# Patient Record
Sex: Female | Born: 2000 | Race: White | Hispanic: No | Smoking: Never smoker
Health system: Southern US, Community
[De-identification: ages and names within clinical notes are randomized; demographics above are authoritative.]

## PROBLEM LIST (undated history)

## (undated) DIAGNOSIS — G35 Multiple sclerosis: Secondary | ICD-10-CM

## (undated) DIAGNOSIS — D649 Anemia, unspecified: Secondary | ICD-10-CM

## (undated) DIAGNOSIS — F99 Mental disorder, not otherwise specified: Secondary | ICD-10-CM

## (undated) DIAGNOSIS — R51 Headache: Secondary | ICD-10-CM

## (undated) HISTORY — DX: Mental disorder, not otherwise specified: F99

## (undated) HISTORY — DX: Anemia, unspecified: D64.9

## (undated) HISTORY — PX: APPENDECTOMY: SHX54

## (undated) HISTORY — PX: OTHER SURGICAL HISTORY: SHX169

---
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
## 2018-02-03 ENCOUNTER — Emergency Department
Admission: EM | Admit: 2018-02-03 | Discharge: 2018-02-03 | Disposition: A | Discharge status: Home / Self Care | Payer: Medicaid Other | Attending: Emergency Medicine | Admitting: Emergency Medicine

## 2018-02-03 ENCOUNTER — Other Ambulatory Visit: Payer: Self-pay

## 2018-02-03 DIAGNOSIS — L550 Sunburn of first degree: Secondary | ICD-10-CM | POA: Insufficient documentation

## 2018-02-03 DIAGNOSIS — F909 Attention-deficit hyperactivity disorder, unspecified type: Secondary | ICD-10-CM | POA: Insufficient documentation

## 2018-02-03 DIAGNOSIS — L559 Sunburn, unspecified: Secondary | ICD-10-CM | POA: Diagnosis present

## 2018-02-03 DIAGNOSIS — L551 Sunburn of second degree: Secondary | ICD-10-CM | POA: Diagnosis not present

## 2018-02-03 DIAGNOSIS — Z79899 Other long term (current) drug therapy: Secondary | ICD-10-CM | POA: Insufficient documentation

## 2018-02-03 MED ORDER — SILVER SULFADIAZINE 1 % EX CREA: TOPICAL_CREAM | CUTANEOUS | 1 refills | Status: DC
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
## 2018-02-03 NOTE — Discharge Instructions (Addendum)
You may also take Aspirin 325mg  as directed on the packaging.  Apply Vitamin E cream to the areas that are not blistered and to the face.
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
## 2018-10-10 ENCOUNTER — Observation Stay
Admission: EM | Admit: 2018-10-10 | Discharge: 2018-10-12 | Disposition: A | Discharge status: Home / Self Care | Payer: 59 | Attending: Internal Medicine | Admitting: Internal Medicine

## 2018-10-10 ENCOUNTER — Emergency Department: Payer: 59

## 2018-10-10 ENCOUNTER — Other Ambulatory Visit: Payer: Self-pay

## 2018-10-10 ENCOUNTER — Encounter: Payer: Self-pay | Admitting: *Deleted

## 2018-10-10 DIAGNOSIS — H579 Unspecified disorder of eye and adnexa: Secondary | ICD-10-CM | POA: Diagnosis present

## 2018-10-10 DIAGNOSIS — H519 Unspecified disorder of binocular movement: Principal | ICD-10-CM | POA: Insufficient documentation

## 2018-10-10 DIAGNOSIS — R112 Nausea with vomiting, unspecified: Secondary | ICD-10-CM | POA: Insufficient documentation

## 2018-10-10 DIAGNOSIS — E876 Hypokalemia: Secondary | ICD-10-CM | POA: Diagnosis not present

## 2018-10-10 DIAGNOSIS — R42 Dizziness and giddiness: Secondary | ICD-10-CM | POA: Insufficient documentation

## 2018-10-10 DIAGNOSIS — H532 Diplopia: Secondary | ICD-10-CM | POA: Diagnosis not present

## 2018-10-10 DIAGNOSIS — H538 Other visual disturbances: Secondary | ICD-10-CM

## 2018-10-10 LAB — CBC
HCT: 40.5 % (ref 36.0–46.0)
HEMOGLOBIN: 13.3 g/dL (ref 12.0–15.0)
MCH: 28.4 pg (ref 26.0–34.0)
MCHC: 32.8 g/dL (ref 30.0–36.0)
MCV: 86.5 fL (ref 80.0–100.0)
Platelets: 330 10*3/uL (ref 150–400)
RBC: 4.68 MIL/uL (ref 3.87–5.11)
RDW: 12.9 % (ref 11.5–15.5)
WBC: 8.1 10*3/uL (ref 4.0–10.5)
nRBC: 0 % (ref 0.0–0.2)

## 2018-10-10 LAB — URINALYSIS, COMPLETE (UACMP) WITH MICROSCOPIC
Bacteria, UA: NONE SEEN
Bilirubin Urine: NEGATIVE
Glucose, UA: NEGATIVE mg/dL
Ketones, ur: NEGATIVE mg/dL
Nitrite: NEGATIVE
Protein, ur: NEGATIVE mg/dL
Specific Gravity, Urine: 1.019 (ref 1.005–1.030)
pH: 5 (ref 5.0–8.0)

## 2018-10-10 LAB — BASIC METABOLIC PANEL
Anion gap: 10 (ref 5–15)
BUN: 8 mg/dL (ref 6–20)
CO2: 23 mmol/L (ref 22–32)
Calcium: 8.7 mg/dL — ABNORMAL LOW (ref 8.9–10.3)
Chloride: 104 mmol/L (ref 98–111)
Creatinine, Ser: 0.63 mg/dL (ref 0.44–1.00)
GFR calc non Af Amer: >60 mL/min (ref >60)
Glucose, Bld: 130 mg/dL — ABNORMAL HIGH (ref 70–99)
Potassium: 3.3 mmol/L — ABNORMAL LOW (ref 3.5–5.1)
Sodium: 137 mmol/L (ref 135–145)

## 2018-10-10 LAB — POCT PREGNANCY, URINE
PREG TEST UR: NEGATIVE
Preg Test, Ur: NEGATIVE

## 2018-10-10 MED ORDER — POTASSIUM CHLORIDE CRYS ER 20 MEQ PO TBCR
40.0000 meq | EXTENDED_RELEASE_TABLET | Freq: Once | ORAL | Status: AC
  Administered 2018-10-10: 40 meq via ORAL
  Filled 2018-10-10: qty 2

## 2018-10-10 MED ORDER — SODIUM CHLORIDE 0.9 % IV BOLUS
1000.0000 mL | Freq: Once | INTRAVENOUS | Status: AC
  Administered 2018-10-10: 1000 mL via INTRAVENOUS

## 2018-10-10 MED ORDER — POLYETHYLENE GLYCOL 3350 17 G PO PACK: 17.0000 g | PACK | Freq: Every day | ORAL | Status: DC | PRN

## 2018-10-10 MED ORDER — ACETAMINOPHEN 650 MG RE SUPP: 650.0000 mg | Freq: Four times a day (QID) | RECTAL | Status: DC | PRN

## 2018-10-10 MED ORDER — ENOXAPARIN SODIUM 40 MG/0.4ML ~~LOC~~ SOLN
40.0000 mg | Freq: Two times a day (BID) | SUBCUTANEOUS | Status: DC
  Administered 2018-10-11: 40 mg via SUBCUTANEOUS
  Filled 2018-10-10: qty 0.4

## 2018-10-10 MED ORDER — ONDANSETRON HCL 4 MG/2ML IJ SOLN: 4.0000 mg | Freq: Four times a day (QID) | INTRAMUSCULAR | Status: DC | PRN

## 2018-10-10 MED ORDER — ONDANSETRON HCL 4 MG PO TABS: 4.0000 mg | ORAL_TABLET | Freq: Four times a day (QID) | ORAL | Status: DC | PRN

## 2018-10-10 MED ORDER — ACETAMINOPHEN 325 MG PO TABS: 650.0000 mg | ORAL_TABLET | Freq: Four times a day (QID) | ORAL | Status: DC | PRN

## 2018-10-10 NOTE — ED Notes (Signed)
Visual acuity L 20/50 and R 20/50

## 2018-10-10 NOTE — ED Notes (Signed)
Family at bedside. 
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
## 2018-10-10 NOTE — Progress Notes (Signed)
Lovenox changed to 40 mg BID for BMI >40 and CrCl >30. 

## 2018-10-10 NOTE — ED Notes (Addendum)
Pt states that she feels dizzy constantly and when she tries to look left or right "feels like two magnets rejecting." Pt was diagnosed with vertigo last week, and states she does not like taking her vertigo medication because it makes her drowsy. Pt went from lying to sitting quickly with no distress.
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
## 2018-10-10 NOTE — ED Notes (Signed)
Sherrie RN, aware of bed assigned 

## 2018-10-10 NOTE — ED Notes (Signed)
Pt back from MRI 

## 2018-10-10 NOTE — ED Triage Notes (Signed)
PT to ED reporting dizziness x 11 days. pT was seen at PCP and given Ativert for vertigo. Pt stopped taking medication because it was not helping and made her drowsy. Pt is now unable to move her eyes to the far left or the far right x 3 days and is unable to focus. PT does not wear glasses's normally but has 20/100 in triage. No new head trauma and no other neuro deficits noted.

## 2018-10-10 NOTE — ED Notes (Signed)
Pt eating dinner.  Family at bedside.

## 2018-10-10 NOTE — Plan of Care (Signed)
  Problem: Education: Goal: Knowledge of General Education information will improve Description: Including pain rating scale, medication(s)/side effects and non-pharmacologic comfort measures Outcome: Progressing   Problem: Clinical Measurements: Goal: Ability to maintain clinical measurements within normal limits will improve Outcome: Progressing Goal: Will remain free from infection Outcome: Progressing   Problem: Activity: Goal: Risk for activity intolerance will decrease Outcome: Progressing   Problem: Safety: Goal: Ability to remain free from injury will improve Outcome: Progressing   Problem: Skin Integrity: Goal: Risk for impaired skin integrity will decrease Outcome: Progressing   

## 2018-10-11 DIAGNOSIS — H519 Unspecified disorder of binocular movement: Secondary | ICD-10-CM | POA: Diagnosis not present

## 2018-10-11 LAB — BASIC METABOLIC PANEL
Anion gap: 4 — ABNORMAL LOW (ref 5–15)
BUN: 8 mg/dL (ref 6–20)
CHLORIDE: 110 mmol/L (ref 98–111)
CO2: 25 mmol/L (ref 22–32)
Calcium: 8.3 mg/dL — ABNORMAL LOW (ref 8.9–10.3)
Creatinine, Ser: 0.54 mg/dL (ref 0.44–1.00)
GFR calc non Af Amer: >60 mL/min (ref >60)
Glucose, Bld: 98 mg/dL (ref 70–99)
Potassium: 4.2 mmol/L (ref 3.5–5.1)
Sodium: 139 mmol/L (ref 135–145)

## 2018-10-11 LAB — PROTIME-INR
INR: 0.99
Prothrombin Time: 13 seconds (ref 11.4–15.2)

## 2018-10-11 LAB — TSH: TSH: 1.507 u[IU]/mL (ref 0.350–4.500)

## 2018-10-11 LAB — VITAMIN B12: Vitamin B-12: 127 pg/mL — ABNORMAL LOW (ref 180–914)

## 2018-10-11 LAB — APTT: APTT: 33 s (ref 24–36)
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
## 2018-10-12 ENCOUNTER — Observation Stay: Payer: 59

## 2018-10-12 DIAGNOSIS — H519 Unspecified disorder of binocular movement: Secondary | ICD-10-CM | POA: Diagnosis not present

## 2018-10-12 LAB — PROTEIN AND GLUCOSE, CSF
Glucose, CSF: 63 mg/dL (ref 40–70)
Total  Protein, CSF: 23 mg/dL (ref 15–45)

## 2018-10-12 LAB — SEDIMENTATION RATE: Sed Rate: 31 mm/hr — ABNORMAL HIGH (ref 0–20)

## 2018-10-12 LAB — CSF CELL COUNT WITH DIFFERENTIAL
Eosinophils, CSF: 0 %
Lymphs, CSF: 94 %
Monocyte-Macrophage-Spinal Fluid: 6 %
OTHER CELLS CSF: 0
RBC Count, CSF: 2 /mm3 (ref 0–3)
Segmented Neutrophils-CSF: 0 %
Tube #: 3
WBC, CSF: 42 /mm3 (ref 0–5)

## 2018-10-12 LAB — HIV ANTIBODY (ROUTINE TESTING W REFLEX): HIV Screen 4th Generation wRfx: NONREACTIVE

## 2018-10-12 MED ORDER — CYANOCOBALAMIN 1000 MCG PO TABS: 1000.0000 ug | ORAL_TABLET | Freq: Every day | ORAL | 1 refills | Status: DC

## 2018-10-12 MED ORDER — GADOBUTROL 1 MMOL/ML IV SOLN
10.0000 mL | Freq: Once | INTRAVENOUS | Status: AC | PRN
  Administered 2018-10-12: 15:00:00 10 mL via INTRAVENOUS

## 2018-10-12 MED ORDER — VITAMIN B-12 1000 MCG PO TABS
1000.0000 ug | ORAL_TABLET | Freq: Every day | ORAL | Status: DC
  Administered 2018-10-12: 1000 ug via ORAL

## 2018-10-12 NOTE — Discharge Summary (Signed)
[facility] [place] NAME: Jasmine Hopkins    MR#:  [id number]  DATE OF BIRTH:  10-Sep-2001  DATE OF ADMISSION:  10/10/2018 ADMITTING PHYSICIAN: Sela Hua, MD  DATE OF DISCHARGE: 10/12/2018  PRIMARY CARE PHYSICIAN: Lorelee Market, MD    ADMISSION DIAGNOSIS:  Diplopia [H53.2] Dizziness [R42] Blurred vision, bilateral [H53.8] Abnormal eye movements [H51.9] Nausea and vomiting, intractability of vomiting not specified, unspecified vomiting type [R11.2]  DISCHARGE DIAGNOSIS:  Active Problems:   Eye problem   SECONDARY DIAGNOSIS:   Past Medical History:  Diagnosis Date  . Headache(784.0)     HOSPITAL COURSE:   * Eye movement disorder   Have no horizontal movements in both eyes.    Seen by ophthalmology, no clear reasons from his side- suggest to follow with Neurology and if not than Conversion disorder.  MRI and CT head negative.   Neuro has ordered Vit B12, Thiamin, TSH .   VIt b12 low- replace   TSH- normal   Thiamin - still in process   ESR high.   LP done- 42 WBCs, mostly Lymphocytes, Normal Glucose.   Discussed with Neurologist- No clear explaination for this WBCs.   Pt is asymptomatic other than eye movements.   Sent multiple other test also.   Neuro suggest to follow in clinic in 1 week to check the results.   Explained pt, her aunt ( pt lives with her) and cousin, they agree with plan.  * Hypokalemia  Improved.  DISCHARGE CONDITIONS:   Stable.  CONSULTS OBTAINED:  Treatment Team:  Alexis Goodell, MD  DRUG ALLERGIES:  No Known Allergies  DISCHARGE MEDICATIONS:   Allergies as of 10/12/2018   No Known Allergies     Medication List    STOP taking these medications   Cetirizine HCl 10 MG Caps   FLUoxetine 20 MG capsule Commonly known as:  PROZAC   methylphenidate 36 MG CR tablet Commonly known as:  CONCERTA   silver sulfADIAZINE 1 % cream Commonly known as:  SILVADENE     TAKE  these medications   cyanocobalamin 1000 MCG tablet Take 1 tablet (1,000 mcg total) by mouth daily.   ibuprofen 400 MG tablet Commonly known as:  ADVIL,MOTRIN Take 1 tablet (400 mg total) by mouth every 6 (six) hours as needed (HA pain). Patient may resume home supply.        DISCHARGE INSTRUCTIONS:    Follow with neuro clinic in 1 week.  If you experience worsening of your admission symptoms, develop shortness of breath, life threatening emergency, suicidal or homicidal thoughts you must seek medical attention immediately by calling 911 or calling your MD immediately  if symptoms less severe.  You Must read complete instructions/literature along with all the possible adverse reactions/side effects for all the Medicines you take and that have been prescribed to you. Take any new Medicines after you have completely understood and accept all the possible adverse reactions/side effects.   Please note  You were cared for by a hospitalist during your hospital stay. If you have any questions about your discharge medications or the care you received while you were in the hospital after you are discharged, you can call the unit and asked to speak with the hospitalist on call if the hospitalist that took care of you is not available. Once you are discharged, your primary care physician will handle any further medical issues. Please note that NO REFILLS for any discharge medications will be  authorized once you are discharged, as it is imperative that you return to your primary care physician (or establish a relationship with a primary care physician if you do not have one) for your aftercare needs so that they can reassess your need for medications and monitor your lab values.    Today   CHIEF COMPLAINT:   Chief Complaint  Patient presents with  . Dizziness    HISTORY OF PRESENT ILLNESS:  Jasmine Hopkins  is a 18 y.o. female with no past medical history who presented to the ED with inability  to move her eyes side to side for the last 3 days.  This occurred out of nowhere.  It has been going on constantly.  When she tries to move her eyes side to side, it feels like "when you trying to push two opposing sides of the magnet together".  She endorses some blurred vision.  She states she had one episode of photophobia yesterday, but it resolved on its own.  She denies any eye pain or trauma to the eyes or head.  She also endorses 1 headache over the past week, but no current headaches. She denies fevers or chills.  No numbness or tingling of her extremities.  No weakness.  No difficulties with balance or walking.  This has never happened to her before.  In the ED, vitals and labs were unremarkable.  CT head and MRI brain were negative.  Hospitalists were called for admission.    VITAL SIGNS:  Blood pressure 120/78, pulse 80, temperature 98.7 F (37.1 C), temperature source Oral, resp. rate 20, height '5\' 4"'  (1.626 m), weight 108.9 kg, last menstrual period 09/24/2018, SpO2 96 %.  I/O:  No intake or output data in the 24 hours ending 10/12/18 1311  PHYSICAL EXAMINATION:   GENERAL:  18 y.o.-year-old obese patient lying in the bed with no acute distress.  EYES: Pupils equal, round, reactive to light and accommodation. No scleral icterus. Extraocular muscles altered, no horizontal movements.  HEENT: Head atraumatic, normocephalic. Oropharynx and nasopharynx clear.  NECK:  Supple, no jugular venous distention. No thyroid enlargement, no tenderness.  LUNGS: Normal breath sounds bilaterally, no wheezing, rales,rhonchi or crepitation. No use of accessory muscles of respiration.  CARDIOVASCULAR: S1, S2 normal. No murmurs, rubs, or gallops.  ABDOMEN: Soft, nontender, nondistended. Bowel sounds present. No organomegaly or mass.  EXTREMITIES: No pedal edema, cyanosis, or clubbing.  NEUROLOGIC: Cranial nerves II through XII are intact- except for having no horizontal movements on her both eyes.  Muscle strength 5/5 in all extremities. Sensation intact. Gait not checked.  PSYCHIATRIC: The patient is alert and oriented x 3.  SKIN: No obvious rash, lesion, or ulcer.   DATA REVIEW:   CBC Recent Labs  Lab 10/10/18 1534  WBC 8.1  HGB 13.3  HCT 40.5  PLT 330    Chemistries  Recent Labs  Lab 10/11/18 0410  NA 139  K 4.2  CL 110  CO2 25  GLUCOSE 98  BUN 8  CREATININE 0.54  CALCIUM 8.3*    Cardiac Enzymes No results for input(s): TROPONINI in the last 168 hours.  Microbiology Results  Results for orders placed or performed during the hospital encounter of 10/10/18  CSF culture     Status: None (Preliminary result)   Collection Time: 10/12/18 11:23 AM  Result Value Ref Range Status   Specimen Description CSF  Final   Special Requests Normal  Final   Gram Stain   Final  NO ORGANISMS SEEN WBC SEEN RBCS SEEN Performed at [facility], [address] [place], [place] [address]    Culture PENDING  Incomplete   Report Status PENDING  Incomplete    RADIOLOGY:  Ct Head Wo Contrast  Result Date: 10/10/2018 CLINICAL DATA:  Dizziness for 11 days.  Decreased visual acuity. EXAM: CT HEAD WITHOUT CONTRAST TECHNIQUE: Contiguous axial images were obtained from the base of the skull through the vertex without intravenous contrast. COMPARISON:  01/07/2014 FINDINGS: Brain: No evidence of acute infarction, hemorrhage, hydrocephalus, extra-axial collection or mass lesion/mass effect. Vascular: No hyperdense vessel or unexpected calcification. Skull: Normal. Negative for fracture or focal lesion. Sinuses/Orbits: No acute finding. Other: None. IMPRESSION: No acute intracranial abnormality. Electronically Signed   By: Fidela Salisbury M.D.   On: 10/10/2018 16:23   Mr Brain Wo Contrast  Result Date: 10/10/2018 CLINICAL DATA:  Dizziness. EXAM: MRI HEAD WITHOUT CONTRAST TECHNIQUE: Multiplanar, multiecho pulse sequences of the brain and surrounding structures were obtained  without intravenous contrast. COMPARISON:  Head CT 10/10/2018 FINDINGS: Brain: There is no evidence of acute infarct, intracranial hemorrhage, mass, midline shift, or extra-axial fluid collection. The ventricles and sulci are normal. Small cystic changes are present in the pineal gland, not felt to be of any clinical significance and without a dominant lesion. The brain is otherwise unremarkable in signal. Vascular: Major intracranial vascular flow voids are preserved. Skull and upper cervical spine: Unremarkable bone marrow signal. Sinuses/Orbits: Unremarkable orbits. Paranasal sinuses and mastoid air cells are clear. Other: None. IMPRESSION: No acute or significant intracranial findings. Electronically Signed   By: Logan Bores M.D.   On: 10/10/2018 19:44    EKG:   Orders placed or performed during the hospital encounter of 10/10/18  . ED EKG  . ED EKG  . EKG      Management plans discussed with the patient, family and they are in agreement.  CODE STATUS:     Code Status Orders  (From admission, onward)         Start     Ordered   10/10/18 2238  Full code  Continuous     10/10/18 2237        Code Status History    Date Active Date Inactive Code Status Order ID Comments User Context   01/01/2014 2144 01/08/2014 1727 Full Code [id number]  Lurena Nida, NP Inpatient      TOTAL TIME TAKING CARE OF THIS PATIENT: 35 minutes.    Vaughan Basta M.D on 10/12/2018 at 1:11 PM  Between 7am to 6pm - Pager - [phone]  After 6pm go to [email or web address] - password [email or web address] [facility]  Office  [phone]  CC: Primary care physician; Lorelee Market, MD   Note: This dictation was prepared with Dragon dictation along with smaller phrase technology. Any transcriptional errors that result from this process are unintentional.

## 2018-10-12 NOTE — Progress Notes (Signed)
MRI and results reviewed by Dr. Thad Ranger and approved discharge.

## 2018-10-12 NOTE — Progress Notes (Signed)
[facility] [place] [facility]   PATIENT NAME: Jasmine Hopkins    MR#:  [id number]  DATE OF BIRTH:  2001/07/25  SUBJECTIVE:  CHIEF COMPLAINT:   Chief Complaint  Patient presents with  . Dizziness   Came with problems with her eye movements, no improvements. No other complains.  REVIEW OF SYSTEMS:  CONSTITUTIONAL: No fever, fatigue or weakness.  EYES: No blurred vision.  EARS, NOSE, AND THROAT: No tinnitus or ear pain.  RESPIRATORY: No cough, shortness of breath, wheezing or hemoptysis.  CARDIOVASCULAR: No chest pain, orthopnea, edema.  GASTROINTESTINAL: No nausea, vomiting, diarrhea or abdominal pain.  GENITOURINARY: No dysuria, hematuria.  ENDOCRINE: No polyuria, nocturia,  HEMATOLOGY: No anemia, easy bruising or bleeding SKIN: No rash or lesion. MUSCULOSKELETAL: No joint pain or arthritis.   NEUROLOGIC: No tingling, numbness, weakness.  PSYCHIATRY: No anxiety or depression.   ROS  DRUG ALLERGIES:  No Known Allergies  VITALS:  Blood pressure 120/78, pulse 80, temperature 98.7 F (37.1 C), temperature source Oral, resp. rate 20, height 5\' 4"  (1.626 m), weight 108.9 kg, last menstrual period 09/24/2018, SpO2 96 %.  PHYSICAL EXAMINATION:  GENERAL:  18 y.o.-year-old patient lying in the bed with no acute distress.  EYES: Pupils equal, round, reactive to light and accommodation. No scleral icterus. Extraocular muscles altered, no horizontal movements.  HEENT: Head atraumatic, normocephalic. Oropharynx and nasopharynx clear.  NECK:  Supple, no jugular venous distention. No thyroid enlargement, no tenderness.  LUNGS: Normal breath sounds bilaterally, no wheezing, rales,rhonchi or crepitation. No use of accessory muscles of respiration.  CARDIOVASCULAR: S1, S2 normal. No murmurs, rubs, or gallops.  ABDOMEN: Soft, nontender, nondistended. Bowel sounds present. No organomegaly or mass.  EXTREMITIES: No pedal edema, cyanosis, or clubbing.  NEUROLOGIC: Cranial  nerves II through XII are intact- except for having no horizontal movements on her both eyes. Muscle strength 5/5 in all extremities. Sensation intact. Gait not checked.  PSYCHIATRIC: The patient is alert and oriented x 3.  SKIN: No obvious rash, lesion, or ulcer.   Physical Exam LABORATORY PANEL:   CBC Recent Labs  Lab 10/10/18 1534  WBC 8.1  HGB 13.3  HCT 40.5  PLT 330   ------------------------------------------------------------------------------------------------------------------  Chemistries  Recent Labs  Lab 10/11/18 0410  NA 139  K 4.2  CL 110  CO2 25  GLUCOSE 98  BUN 8  CREATININE 0.54  CALCIUM 8.3*   ------------------------------------------------------------------------------------------------------------------  Cardiac Enzymes No results for input(s): TROPONINI in the last 168 hours. ------------------------------------------------------------------------------------------------------------------  RADIOLOGY:  Ct Head Wo Contrast  Result Date: 10/10/2018 CLINICAL DATA:  Dizziness for 11 days.  Decreased visual acuity. EXAM: CT HEAD WITHOUT CONTRAST TECHNIQUE: Contiguous axial images were obtained from the base of the skull through the vertex without intravenous contrast. COMPARISON:  01/07/2014 FINDINGS: Brain: No evidence of acute infarction, hemorrhage, hydrocephalus, extra-axial collection or mass lesion/mass effect. Vascular: No hyperdense vessel or unexpected calcification. Skull: Normal. Negative for fracture or focal lesion. Sinuses/Orbits: No acute finding. Other: None. IMPRESSION: No acute intracranial abnormality. Electronically Signed   By: Ted Mcalpine M.D.   On: 10/10/2018 16:23   Mr Brain Wo Contrast  Result Date: 10/10/2018 CLINICAL DATA:  Dizziness. EXAM: MRI HEAD WITHOUT CONTRAST TECHNIQUE: Multiplanar, multiecho pulse sequences of the brain and surrounding structures were obtained without intravenous contrast. COMPARISON:  Head CT  10/10/2018 FINDINGS: Brain: There is no evidence of acute infarct, intracranial hemorrhage, mass, midline shift, or extra-axial fluid collection. The ventricles and sulci are normal. Small cystic changes  are present in the pineal gland, not felt to be of any clinical significance and without a dominant lesion. The brain is otherwise unremarkable in signal. Vascular: Major intracranial vascular flow voids are preserved. Skull and upper cervical spine: Unremarkable bone marrow signal. Sinuses/Orbits: Unremarkable orbits. Paranasal sinuses and mastoid air cells are clear. Other: None. IMPRESSION: No acute or significant intracranial findings. Electronically Signed   By: Sebastian Ache M.D.   On: 10/10/2018 19:44    ASSESSMENT AND PLAN:   Active Problems:   Eye problem  * Eye movement disorder   Have no horizontal movements in both eyes.    Seen by ophthalmology, no clear reasons from his side- suggest to follow with Neurology and if not than Conversion disorder.  MRI and CT head negative.   Neuro has ordered Vit B12, Thiamin, TSH and plan for LP tomorrow.  * Hypokalemia  Improved.  All the records are reviewed and case discussed with Care Management/Social Workerr. Management plans discussed with the patient, family and they are in agreement.  CODE STATUS: Full.  TOTAL TIME TAKING CARE OF THIS PATIENT: 35 minutes.     POSSIBLE D/C IN 1-2 DAYS, DEPENDING ON CLINICAL CONDITION.   Altamese Dilling M.D on 10/12/2018   Between 7am to 6pm - Pager - [phone]  After 6pm go to [email or web address] - password [email or web address]  [facility] [place] [facility]  Office  [phone]  CC: Primary care physician; Evelene Croon, MD  Note: This dictation was prepared with Dragon dictation along with smaller phrase technology. Any transcriptional errors that result from this process are unintentional.

## 2018-10-12 NOTE — Progress Notes (Signed)
[facility]         [place], [place].   10/12/2018  Patient: Jasmine Hopkins   Date of Birth:  12-Jul-2001  Date of admission:  10/10/2018  Date of Discharge  10/12/2018    To Whom it May Concern:   Jamaka Wildey  may return to work on 10/14/18.  PHYSICAL ACTIVITY:  Full  If you have any questions or concerns, please don't hesitate to call.  Sincerely,   Altamese Dilling M.D Pager [phone] Office : [phone]   .

## 2018-10-12 NOTE — Progress Notes (Signed)
Pt D/C to home with family. IV removed intact. VSS. Education completed. All questions answered. Belongings sent with pt.

## 2018-10-12 NOTE — Progress Notes (Signed)
Dr. Thad Ranger suggested MRI brain with contrast.  Spoke to nurse and pt- will get MRI and let Dr. Thad Ranger review that before discharge.

## 2018-10-12 NOTE — Procedures (Addendum)
LP Procedure Note:  Patient has been seen and examined.  Symptoms unchanged.  Continues to have no ability to make lateral eye movements.  Chart has been reviewed.  LP is being performed to rule out MS and BIH.  Procedure has been explained to patient/family including risks and benefits.  Consent has been signed by patient/family and witnessed. A time out was performed.  Blood pressure 97/62, pulse 84, temperature 98.1 F (36.7 C), temperature source Oral, resp. rate 18, height 5\' 4"  (1.626 m), weight 108.9 kg, last menstrual period 09/24/2018, SpO2 96 %.   Current Facility-Administered Medications:  .  acetaminophen (TYLENOL) tablet 650 mg, 650 mg, Oral, Q6H PRN **OR** acetaminophen (TYLENOL) suppository 650 mg, 650 mg, Rectal, Q6H PRN, Mayo, Allyn Kenner, MD .  ondansetron (ZOFRAN) tablet 4 mg, 4 mg, Oral, Q6H PRN **OR** ondansetron (ZOFRAN) injection 4 mg, 4 mg, Intravenous, Q6H PRN, Mayo, Allyn Kenner, MD .  polyethylene glycol (MIRALAX / GLYCOLAX) packet 17 g, 17 g, Oral, Daily PRN, Campbell Stall, MD  Recent Labs    10/10/18 1534 10/11/18 1451  WBC 8.1  --   HGB 13.3  --   HCT 40.5  --   PLT 330  --   INR  --  0.99    CT of head: TECHNIQUE: Contiguous axial images were obtained from the base of the skull through the vertex without intravenous contrast.  COMPARISON:  01/07/2014  FINDINGS: Brain: No evidence of acute infarction, hemorrhage, hydrocephalus, extra-axial collection or mass lesion/mass effect.  Vascular: No hyperdense vessel or unexpected calcification.  Skull: Normal. Negative for fracture or focal lesion.  Sinuses/Orbits: No acute finding.  Other: None.  IMPRESSION: No acute intracranial abnormality.   Patient was placed in the lateral decub position.  Area was cleaned with betadine and anesthetized with lidocaine.  Under sterile conditions 20G LP needle was placed at approximately L3-4 without difficulty.  Opening pressure was documented at 20,  closing pressure at 14.  Approximately 17cc of clear and colorless fluid was obtained and sent for studies.  No complications were noted.    If initial studies unremarkable and patient doing well this afternoon may be discharged for remaining LP results to be followed up on an outpatient basis.    Thana Farr, MD Neurology [phone] 10/12/2018  11:26 AM

## 2018-10-14 LAB — ACTH: C206 ACTH: 10.7 pg/mL (ref 7.2–63.3)

## 2018-10-14 LAB — IGG CSF INDEX
Albumin CSF-mCnc: 10 mg/dL — ABNORMAL LOW (ref 11–48)
Albumin: 3.9 g/dL (ref 3.5–5.5)
CSF IgG Index: 1 — ABNORMAL HIGH (ref 0.0–0.7)
IGG CSF: 2.5 mg/dL (ref 0.0–8.6)
IgG (Immunoglobin G), Serum: 1022 mg/dL (ref 549–1584)
IgG/Alb Ratio, CSF: 0.25 (ref 0.00–0.25)

## 2018-10-14 LAB — MYELIN BASIC PROTEIN, CSF: Myelin Basic Protein: 2.8 ng/mL (ref 0.0–2.9)

## 2018-10-14 LAB — PATHOLOGIST SMEAR REVIEW

## 2018-10-14 LAB — VITAMIN B1: Vitamin B1 (Thiamine): 100.9 nmol/L (ref 66.5–200.0)

## 2018-10-15 ENCOUNTER — Other Ambulatory Visit
Admission: RE | Admit: 2018-10-15 | Discharge: 2018-10-15 | Disposition: A | Discharge status: Home / Self Care | Payer: 59 | Source: Ambulatory Visit | Attending: Neurology | Admitting: Neurology

## 2018-10-15 DIAGNOSIS — H499 Unspecified paralytic strabismus: Secondary | ICD-10-CM | POA: Diagnosis not present

## 2018-10-15 LAB — CSF CULTURE W GRAM STAIN
Culture: NO GROWTH
Gram Stain: NONE SEEN
Special Requests: NORMAL

## 2018-10-15 LAB — HEAVY METALS, BLOOD
Arsenic: 6 ug/L (ref 2–23)
Lead: NOT DETECTED ug/dL (ref 0–4)
Mercury: NOT DETECTED ug/L (ref 0.0–14.9)

## 2018-10-15 LAB — OLIGOCLONAL BANDS, CSF + SERM

## 2018-10-15 LAB — B. BURGDORFI ANTIBODIES: B burgdorferi Ab IgG+IgM: <0.91 {ISR} (ref 0.00–0.90)

## 2018-10-15 LAB — CSF CULTURE

## 2018-10-16 ENCOUNTER — Other Ambulatory Visit: Payer: Self-pay | Admitting: Neurology

## 2018-10-16 DIAGNOSIS — R9089 Other abnormal findings on diagnostic imaging of central nervous system: Secondary | ICD-10-CM

## 2018-10-16 DIAGNOSIS — H499 Unspecified paralytic strabismus: Secondary | ICD-10-CM

## 2018-10-16 LAB — VDRL, CSF: VDRL Quant, CSF: NONREACTIVE

## 2018-10-18 LAB — B. BURGDORFI ANTIBODIES, CSF: B. Burgdorferi IgM Ab Index: 0.3 Index (ref <1.0)

## 2018-10-29 ENCOUNTER — Other Ambulatory Visit (HOSPITAL_COMMUNITY): Payer: Self-pay | Admitting: Neurology

## 2018-10-29 ENCOUNTER — Other Ambulatory Visit: Payer: Self-pay | Admitting: Neurology

## 2018-10-29 DIAGNOSIS — G35 Multiple sclerosis: Secondary | ICD-10-CM

## 2018-10-30 ENCOUNTER — Ambulatory Visit
Admission: RE | Admit: 2018-10-30 | Discharge: 2018-10-30 | Disposition: A | Discharge status: Home / Self Care | Payer: 59 | Source: Ambulatory Visit | Attending: Neurology | Admitting: Neurology

## 2018-10-30 DIAGNOSIS — R9089 Other abnormal findings on diagnostic imaging of central nervous system: Secondary | ICD-10-CM | POA: Insufficient documentation

## 2018-10-30 DIAGNOSIS — G35 Multiple sclerosis: Secondary | ICD-10-CM | POA: Diagnosis not present

## 2018-10-30 DIAGNOSIS — H499 Unspecified paralytic strabismus: Secondary | ICD-10-CM | POA: Insufficient documentation

## 2018-10-30 MED ORDER — GADOBUTROL 1 MMOL/ML IV SOLN: 10.0000 mL | Freq: Once | INTRAVENOUS | Status: DC | PRN

## 2018-10-30 MED ORDER — GADOBUTROL 1 MMOL/ML IV SOLN
7.5000 mL | Freq: Once | INTRAVENOUS | Status: AC | PRN
  Administered 2018-10-30: 7.5 mL via INTRAVENOUS

## 2018-11-05 ENCOUNTER — Ambulatory Visit: Payer: 59

## 2019-06-12 ENCOUNTER — Other Ambulatory Visit: Payer: Self-pay

## 2019-06-12 ENCOUNTER — Encounter: Payer: Self-pay | Admitting: Advanced Practice Midwife

## 2019-06-12 ENCOUNTER — Ambulatory Visit: Payer: Self-pay | Admitting: Advanced Practice Midwife

## 2019-06-12 DIAGNOSIS — Z113 Encounter for screening for infections with a predominantly sexual mode of transmission: Secondary | ICD-10-CM

## 2019-06-12 DIAGNOSIS — F419 Anxiety disorder, unspecified: Secondary | ICD-10-CM

## 2019-06-12 DIAGNOSIS — N760 Acute vaginitis: Secondary | ICD-10-CM

## 2019-06-12 DIAGNOSIS — F332 Major depressive disorder, recurrent severe without psychotic features: Secondary | ICD-10-CM

## 2019-06-12 DIAGNOSIS — F4310 Post-traumatic stress disorder, unspecified: Secondary | ICD-10-CM

## 2019-06-12 DIAGNOSIS — B9689 Other specified bacterial agents as the cause of diseases classified elsewhere: Secondary | ICD-10-CM

## 2019-06-12 LAB — WET PREP FOR TRICH, YEAST, CLUE
Clue Cell Exam: POSITIVE — AB
Trichomonas Exam: NEGATIVE

## 2019-06-12 MED ORDER — METRONIDAZOLE 500 MG PO TABS: 500.0000 mg | ORAL_TABLET | Freq: Two times a day (BID) | ORAL | 0 refills | Status: AC

## 2019-06-12 NOTE — Progress Notes (Signed)
Patient here for STD testing.Tangelia Sanson Brewer-Jensen, RN 

## 2019-06-12 NOTE — Progress Notes (Signed)
    STI clinic/screening visit  Subjective:  Jasmine Hopkins is a 18 y.o. nullip female being seen today for an STI screening visit. The patient reports they do have symptoms.  Patient has the following medical conditions:   Patient Active Problem List   Diagnosis Date Noted  . Anxiety 06/12/2019  . Eye problem 10/10/2018  . PTSD (post-traumatic stress disorder) 01/02/2014  . Suicidal ideations 01/02/2014  . ADHD (attention deficit hyperactivity disorder) 01/02/2014  . Suicide ideation 01/02/2014  . MDD (major depressive disorder), recurrent severe, without psychosis ([place]) 01/01/2014     Chief Complaint  Patient presents with  . Exposure to STD    HPI  Patient reports unusual smell x2 weeks and yellow tint to vaginal d/c.  Hx lap 2013.  Last ETOH 11/2018 (2 shots Everclear) 2x/year.  Happy with ocp's  See flowsheet for further details and programmatic requirements.    The following portions of the patient's history were reviewed and updated as appropriate: allergies, current medications, past medical history, past social history, past surgical history and problem list.  Objective:  There were no vitals filed for this visit.  Physical Exam Constitutional:      Appearance: Normal appearance. She is obese.  HENT:     Head: Normocephalic and atraumatic.     Nose: Nose normal.     Mouth/Throat:     Mouth: Mucous membranes are moist.  Eyes:     Conjunctiva/sclera: Conjunctivae normal.  Neck:     Musculoskeletal: Neck supple.  Pulmonary:     Effort: Pulmonary effort is normal.     Breath sounds: Normal breath sounds.  Abdominal:     Palpations: Abdomen is soft. There is no mass.     Tenderness: There is no guarding.     Comments: Poor tone, soft without tenderness, increased adipose  Genitourinary:    General: Normal vulva.     Exam position: Lithotomy position.     Pubic Area: No rash or pubic lice.      Labia:        Right: No rash or lesion.        Left: No rash  or lesion.      Vagina: Vaginal discharge (white creamy, ph>4.5) present.     Cervix: Normal.     Uterus: Normal.      Adnexa: Right adnexa normal and left adnexa normal.     Rectum: Normal.  Skin:    General: Skin is warm and dry.  Neurological:     Mental Status: She is alert.  Psychiatric:        Mood and Affect: Mood normal.       Assessment and Plan:  Jasmine Hopkins is a 18 y.o. female presenting to the [facility] for STI screening  1. Screening examination for venereal disease Treat wet mount per standing orders Immunization nurse consult - WET PREP FOR [place], YEAST, [place] culture  2. PTSD (post-traumatic stress disorder) dx'd 2016; declines counseling  3. MDD (major depressive disorder), recurrent severe, without psychosis ([place]) No meds currently; dx'd 2016  4. Anxiety No meds currently     Return if symptoms worsen or fail to improve.  No future appointments.  Herbie Saxon, CNM

## 2019-06-16 LAB — GONOCOCCUS CULTURE

## 2019-07-22 ENCOUNTER — Other Ambulatory Visit: Payer: 59

## 2019-08-20 ENCOUNTER — Other Ambulatory Visit: Payer: Self-pay

## 2019-08-20 ENCOUNTER — Ambulatory Visit: Payer: Self-pay | Admitting: Physician Assistant

## 2019-08-20 DIAGNOSIS — Z113 Encounter for screening for infections with a predominantly sexual mode of transmission: Secondary | ICD-10-CM

## 2019-08-21 ENCOUNTER — Encounter: Payer: Self-pay | Admitting: Physician Assistant

## 2019-08-21 LAB — WET PREP FOR TRICH, YEAST, CLUE
Trichomonas Exam: NEGATIVE
Yeast Exam: NEGATIVE

## 2019-08-21 NOTE — Progress Notes (Signed)
S:  Patient into clinic requesting recheck for BV. States that she was seen about 1-2 months ago and has not had any change in her history or change of partner but  Has noticed a "different" smell for the last 3-4 days.  Last sex was 2 days ago without condoms and is using OCs for [facility].  Denies late/missed OCs and last period was 07/30/2019 and normal.  Reviewed with patient history from last visit and no changes per patient except as noted above.  O:  WDWN female in NAD, A&O x 3; abdomen= soft, nt, no masses, rebound or tenderness; external genitalia=normal female without nits, lice, edema, erythema, lesions and inguinal adenopathy; vagina= normal mucosa and discharge, pH=45. Wet mount= neg clue, yeast and trich; pos amine. A/P:  1.  Patient requests check for BV. 2.  Counseled that due to hormonal changes she could notice differences in discharge amount, appearance and odor. 3.  Reviewed with patient Steps to prevent BV and yeast: Wear all-cotton underwear Sleep without underwear Take showers instead of baths Wear loose fitting clothing, especially during warm/hot weather Use a hair dryer on low after bathing to dry the area Avoid scented soaps and body washes Do not douche May try over the counter probiotics or boric acid gel or suppositories Stop smoking 4.  RTC if symptoms persist or worsen.

## 2020-01-19 IMAGING — MR MR HEAD WO/W CM
13 series · 45 of 48 positions shown · IV contrast (Gadavist)
Comparison: MRI brain 10/10/2018

Addendum:
CLINICAL DATA: Dizziness. Nystagmus. Decreased visual acuity.
Symptoms began 11 days ago.

EXAM:
MRI HEAD WITHOUT AND WITH CONTRAST
TECHNIQUE: Multiplanar, multiecho pulse sequences of the brain and surrounding
structures were obtained without and with intravenous contrast.
CONTRAST:  10 ml Gadavist

[Series 5: ax dwi_tracew · axial · 3.0mm · 0.60mm/px · z∈[-75,+86]mm · 4 of 55 slices shown]
[im 1/55]
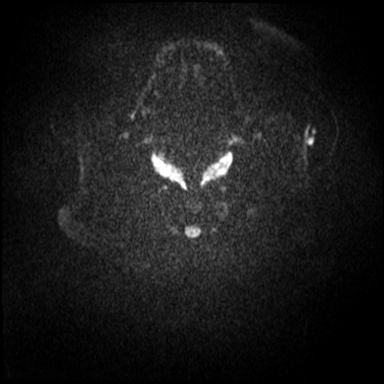
[im 19/55]
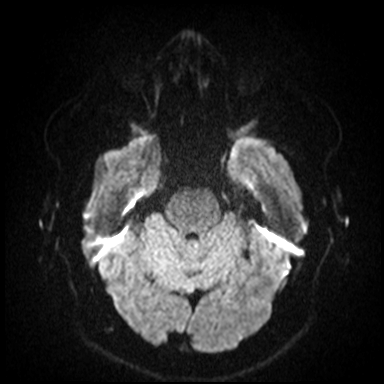
[im 37/55]
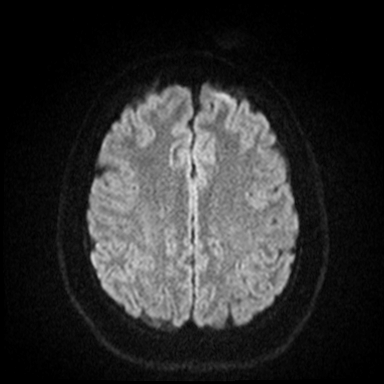
[im 55/55]
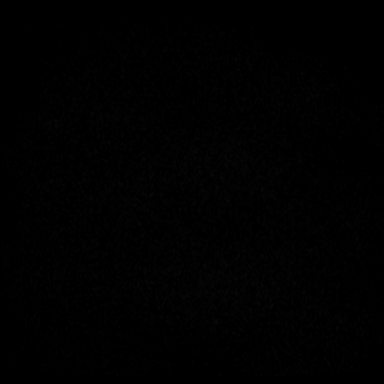

[Series 6: ax dwi_adc · axial · 3.0mm · 0.60mm/px · z∈[-75,+83]mm · 3 of 54 slices shown]
[im 1/54]
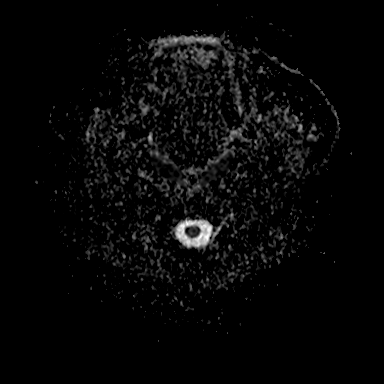
[im 27/54]
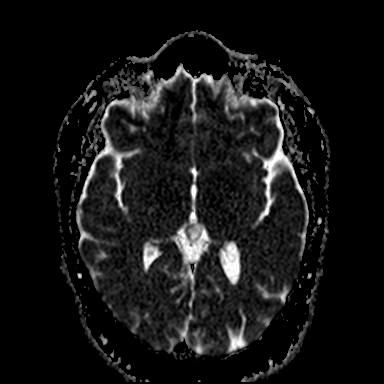
[im 54/54]
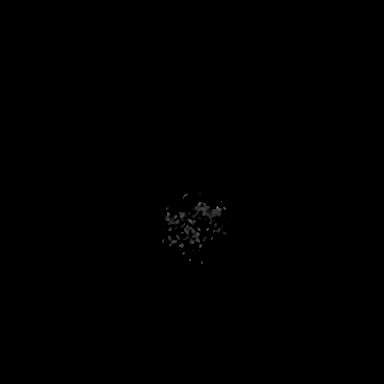

[Series 7: cor dwi_tracew · coronal · 5.0mm · 0.60mm/px · 2 of 38 slices shown]
[im 1/38]
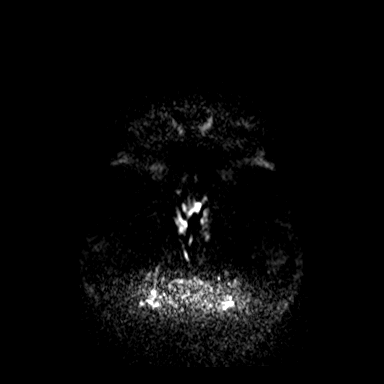
[im 38/38]
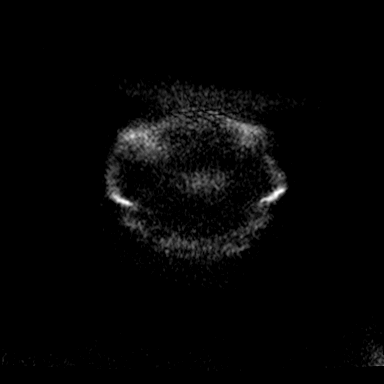

[Series 8: cor dwi_adc · coronal · 5.0mm · 0.60mm/px · 2 of 38 slices shown]
[im 1/38]
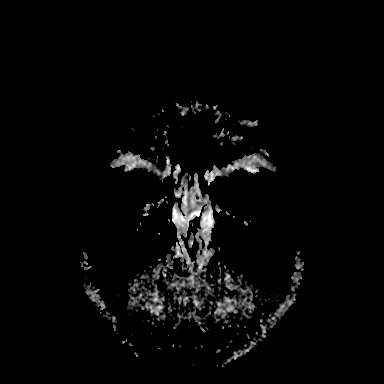
[im 38/38]
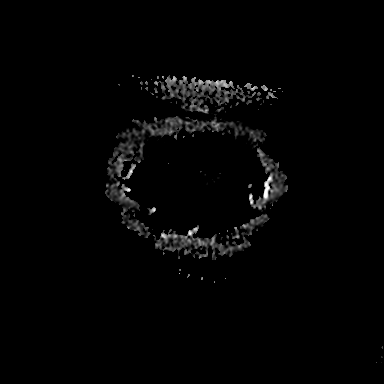

[Series 9: T1 · sagittal · 5.0mm · 0.62mm/px · 1 of 25 slices shown (1 of 2)]
[im 1/25]
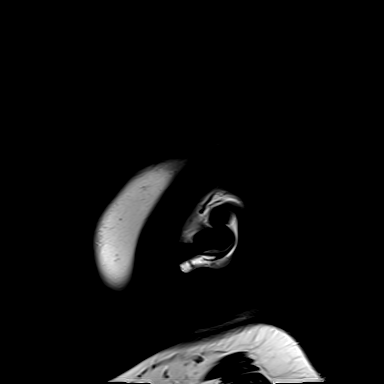

[Series 10: T2 · axial · 5.0mm · 0.55mm/px · z∈[-72,+83]mm · 2 of 27 slices shown]
[im 1/27]
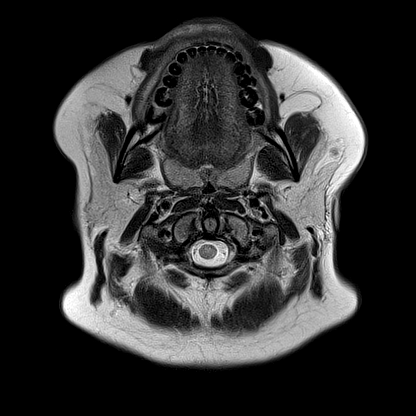
[im 27/27]
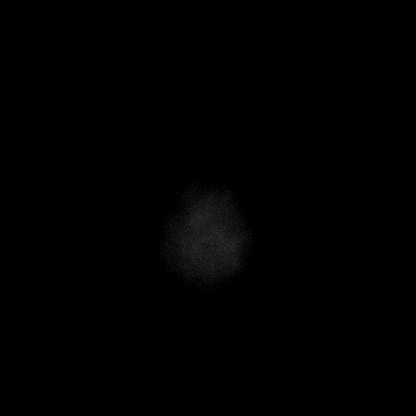

[Series 11: swi_images · axial · 3.0mm · 0.90mm/px · z∈[-82,+94]mm · 4 of 60 slices shown]
[im 1/60]
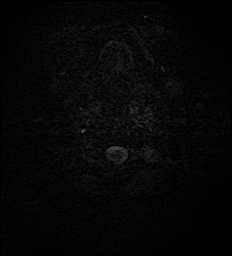
[im 20/60]
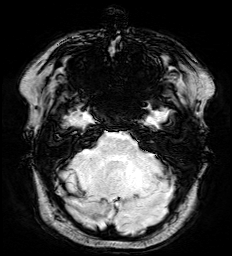
[im 40/60]
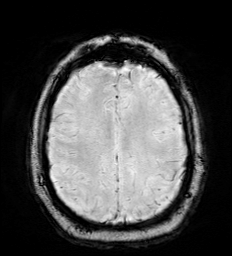
[im 60/60]
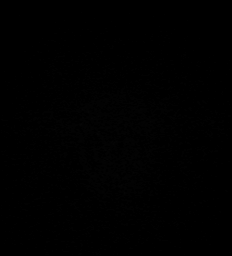

[Series 12: mip_images(sw) · axial · 24.0mm · 0.90mm/px · z∈[-72,+84]mm · 3 of 53 slices shown]
[im 1/53]
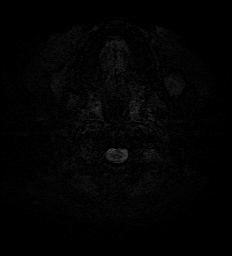
[im 27/53]
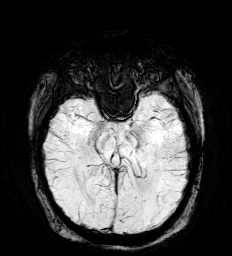
[im 53/53]
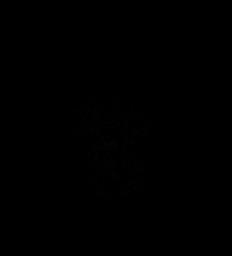

[Series 13: FLAIR · axial · 3.0mm · 0.55mm/px · z∈[-75,+86]mm · 3 of 55 slices shown]
[im 1/55]
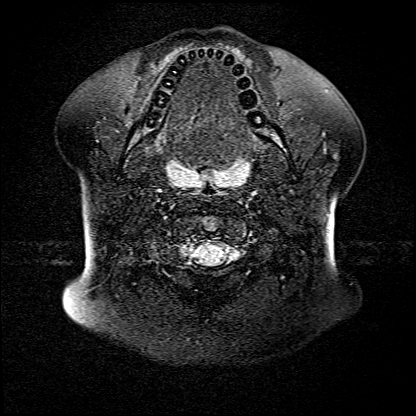
[im 28/55]
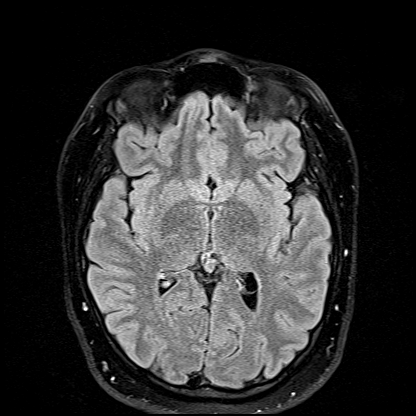
[im 55/55]
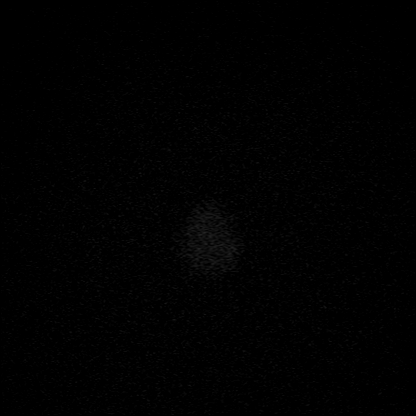

[Series 14: T1 · axial · 1.0mm · 0.98mm/px · z∈[-82,+90]mm · 8 of 170 slices shown (2 of 2)]
[im 1/170]
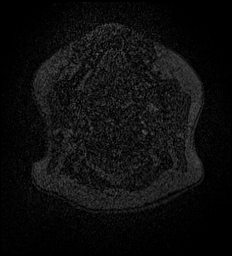
[im 19/170]
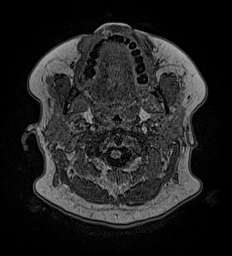
[im 57/170]
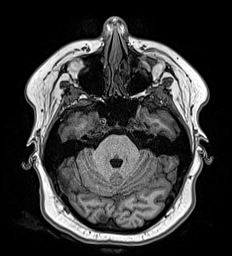
[im 76/170]
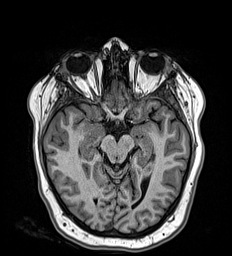
[im 94/170]
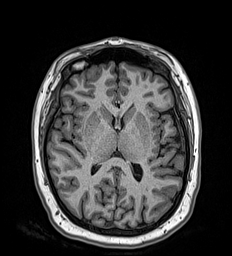
[im 113/170]
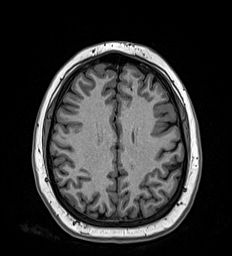
[im 151/170]
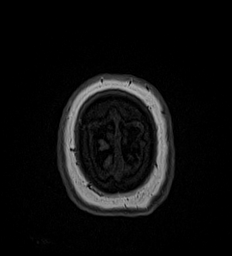
[im 170/170]
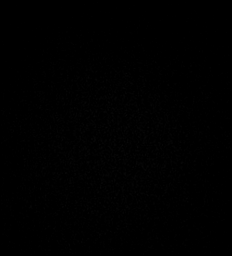

[Series 15: T2 post-contrast · coronal · 5.0mm · 0.57mm/px · 2 of 30 slices shown]
[im 1/30]
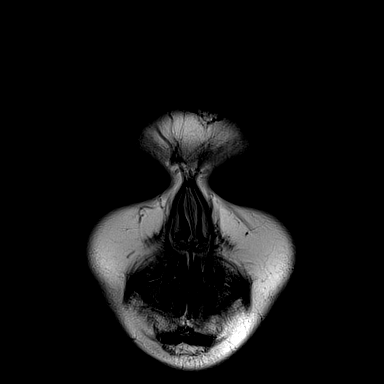
[im 30/30]
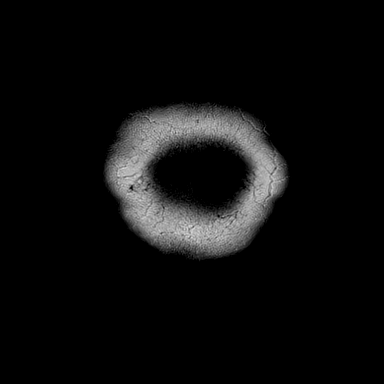

[Series 16: T1 post-contrast · axial · 1.0mm · 0.98mm/px · z∈[-82,+87]mm · 9 of 171 slices shown (1 of 2)]
[im 1/171]
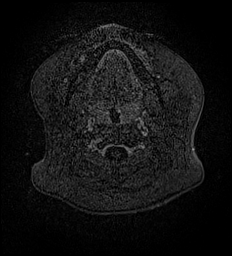
[im 19/171]
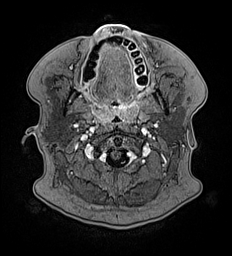
[im 38/171]
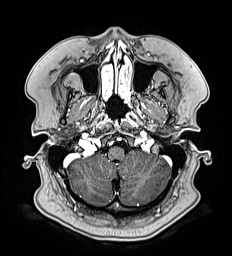
[im 57/171]
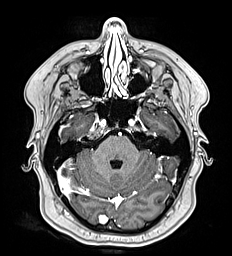
[im 76/171]
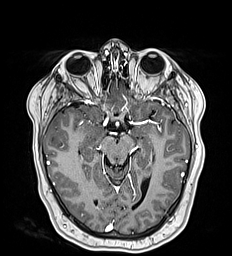
[im 95/171]
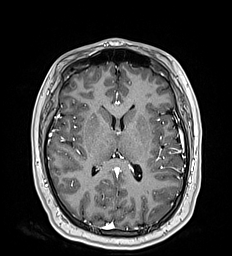
[im 114/171]
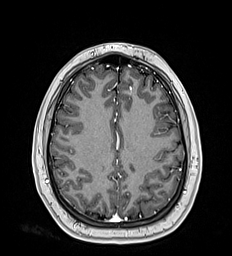
[im 152/171]
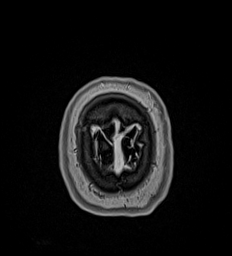
[im 171/171]
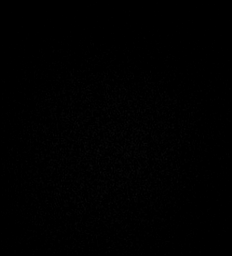

[Series 17: T1 post-contrast · coronal · 5.0mm · 0.57mm/px · 2 of 30 slices shown (2 of 2)]
[im 1/30]
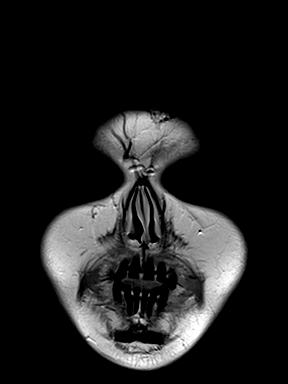
[im 30/30]
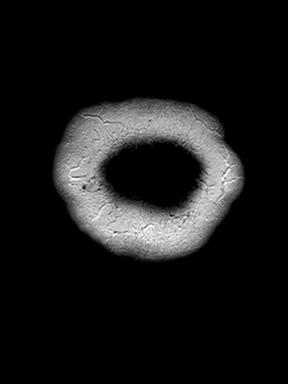

[45 of 48 positions shown; findings below may reference images not displayed]

FINDINGS: Brain: No acute infarct, hemorrhage, or mass lesion is present. The
ventricles are of normal size. No significant white matter lesions
are present. No significant extraaxial fluid collection is present.

The internal auditory canals are within normal limits. The brainstem
and cerebellum are within normal limits.

Postcontrast images demonstrate no pathologic enhancement.

Vascular: Linear enhancement in the left frontal lobe is consistent
with a benign developmental venous anomaly. Flow is present in the
major intracranial arteries.

Skull and upper cervical spine: The craniocervical junction is
normal. Upper cervical spine is within normal limits. Marrow signal
is unremarkable.

Sinuses/Orbits: The paranasal sinuses and mastoid air cells are
clear. The globes and orbits are within normal limits.
IMPRESSION: Normal MRI brain without and with contrast. No acute or focal lesion
to explain nystagmus.

ADDENDUM:
A focal area of T2 signal hyperintensity is present in the posterior
pons along the ventral surface of the fourth ventricle measuring 8 x
7 mm. This was initially felt to be artifactual, appears to be
present on all sequences. This corresponds with the sixth nerve
nucleus.

This is concerning for focal ischemia or demyelination impacting the
sixth nerve nucleus. While no other white matter lesions are
present, imaging of the neural access for other demyelinating
disease is recommended.

These results were called by telephone at the time of interpretation
on 10/15/2018 at [DATE] to Dr. XIXIAN, who verbally acknowledged these
results.

*** End of Addendum ***

## 2020-05-22 ENCOUNTER — Other Ambulatory Visit: Payer: Self-pay

## 2020-05-22 DIAGNOSIS — K047 Periapical abscess without sinus: Secondary | ICD-10-CM | POA: Insufficient documentation

## 2020-05-22 DIAGNOSIS — K0889 Other specified disorders of teeth and supporting structures: Secondary | ICD-10-CM | POA: Insufficient documentation

## 2020-05-22 DIAGNOSIS — K029 Dental caries, unspecified: Secondary | ICD-10-CM | POA: Insufficient documentation

## 2020-05-22 NOTE — ED Triage Notes (Signed)
Pt states right lower jaw tooth abscess for 5-6 days. Pt appears in no acute distress.

## 2020-05-23 ENCOUNTER — Emergency Department
Admission: EM | Admit: 2020-05-23 | Discharge: 2020-05-23 | Disposition: A | Discharge status: Home / Self Care | Payer: 59 | Attending: Emergency Medicine | Admitting: Emergency Medicine

## 2020-05-23 DIAGNOSIS — K047 Periapical abscess without sinus: Secondary | ICD-10-CM

## 2020-05-23 DIAGNOSIS — K029 Dental caries, unspecified: Secondary | ICD-10-CM

## 2020-05-23 DIAGNOSIS — K0889 Other specified disorders of teeth and supporting structures: Secondary | ICD-10-CM

## 2020-05-23 MED ORDER — OXYCODONE-ACETAMINOPHEN 5-325 MG PO TABS
1.0000 | ORAL_TABLET | Freq: Once | ORAL | Status: AC
  Administered 2020-05-23: 1 via ORAL
  Filled 2020-05-23: qty 1

## 2020-05-23 MED ORDER — IBUPROFEN 800 MG PO TABS
800.0000 mg | ORAL_TABLET | Freq: Once | ORAL | Status: AC
  Administered 2020-05-23: 800 mg via ORAL
  Filled 2020-05-23: qty 1

## 2020-05-23 MED ORDER — IBUPROFEN 800 MG PO TABS: 800.0000 mg | ORAL_TABLET | Freq: Three times a day (TID) | ORAL | 0 refills | Status: DC | PRN

## 2020-05-23 MED ORDER — AMOXICILLIN 500 MG PO CAPS
500.0000 mg | ORAL_CAPSULE | Freq: Once | ORAL | Status: AC
  Administered 2020-05-23: 500 mg via ORAL
  Filled 2020-05-23: qty 1

## 2020-05-23 MED ORDER — AMOXICILLIN 500 MG PO CAPS: 500.0000 mg | ORAL_CAPSULE | Freq: Three times a day (TID) | ORAL | 0 refills | Status: DC

## 2020-05-23 MED ORDER — OXYCODONE-ACETAMINOPHEN 5-325 MG PO TABS: 1.0000 | ORAL_TABLET | ORAL | 0 refills | Status: DC | PRN
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
## 2020-05-23 NOTE — Discharge Instructions (Addendum)
1. Take antibiotic as prescribed (Amoxicillin 500mg three times daily x 7 days). °2. Take pain medicines as needed (Motrin/Percocet #15). °3. Return to the ER for worsening symptoms, persistent vomiting, fever, difficulty breathing or other concerns. ° °

## 2020-05-29 ENCOUNTER — Emergency Department: Payer: Self-pay

## 2020-05-29 ENCOUNTER — Observation Stay
Admission: EM | Admit: 2020-05-29 | Discharge: 2020-05-31 | Disposition: A | Discharge status: Home / Self Care | Payer: Self-pay | Attending: Surgery | Admitting: Surgery

## 2020-05-29 ENCOUNTER — Inpatient Hospital Stay: Admit: 2020-05-29 | Payer: Self-pay | Admitting: Surgery

## 2020-05-29 ENCOUNTER — Encounter
Admission: EM | Disposition: A | Discharge status: Home / Self Care | Payer: Self-pay | Source: Home / Self Care | Attending: Student in an Organized Health Care Education/Training Program

## 2020-05-29 ENCOUNTER — Other Ambulatory Visit: Payer: Self-pay

## 2020-05-29 ENCOUNTER — Observation Stay: Payer: Self-pay | Admitting: Anesthesiology

## 2020-05-29 DIAGNOSIS — Z20822 Contact with and (suspected) exposure to covid-19: Secondary | ICD-10-CM | POA: Insufficient documentation

## 2020-05-29 DIAGNOSIS — F909 Attention-deficit hyperactivity disorder, unspecified type: Secondary | ICD-10-CM | POA: Insufficient documentation

## 2020-05-29 DIAGNOSIS — K3580 Unspecified acute appendicitis: Principal | ICD-10-CM | POA: Diagnosis present

## 2020-05-29 DIAGNOSIS — K3531 Acute appendicitis with localized peritonitis and gangrene, without perforation: Secondary | ICD-10-CM

## 2020-05-29 DIAGNOSIS — Z79899 Other long term (current) drug therapy: Secondary | ICD-10-CM | POA: Insufficient documentation

## 2020-05-29 HISTORY — DX: Multiple sclerosis: G35

## 2020-05-29 HISTORY — PX: XI ROBOTIC LAPAROSCOPIC ASSISTED APPENDECTOMY: SHX6877

## 2020-05-29 LAB — COMPREHENSIVE METABOLIC PANEL
ALT: 17 U/L (ref 0–44)
AST: 17 U/L (ref 15–41)
Albumin: 4 g/dL (ref 3.5–5.0)
Alkaline Phosphatase: 67 U/L (ref 38–126)
Anion gap: 7 (ref 5–15)
BUN: 10 mg/dL (ref 6–20)
CO2: 22 mmol/L (ref 22–32)
Calcium: 8.8 mg/dL — ABNORMAL LOW (ref 8.9–10.3)
Chloride: 105 mmol/L (ref 98–111)
Creatinine, Ser: 0.57 mg/dL (ref 0.44–1.00)
GFR calc Af Amer: >60 mL/min (ref >60)
GFR calc non Af Amer: >60 mL/min (ref >60)
Glucose, Bld: 116 mg/dL — ABNORMAL HIGH (ref 70–99)
Potassium: 3.7 mmol/L (ref 3.5–5.1)
Sodium: 134 mmol/L — ABNORMAL LOW (ref 135–145)
Total Bilirubin: 0.8 mg/dL (ref 0.3–1.2)
Total Protein: 7.7 g/dL (ref 6.5–8.1)

## 2020-05-29 LAB — URINALYSIS, COMPLETE (UACMP) WITH MICROSCOPIC
Bacteria, UA: NONE SEEN
Bilirubin Urine: NEGATIVE
Glucose, UA: NEGATIVE mg/dL
Hgb urine dipstick: NEGATIVE
Ketones, ur: NEGATIVE mg/dL
Leukocytes,Ua: NEGATIVE
Nitrite: NEGATIVE
Protein, ur: NEGATIVE mg/dL
Specific Gravity, Urine: 1.018 (ref 1.005–1.030)
pH: 6 (ref 5.0–8.0)

## 2020-05-29 LAB — POC URINE PREG, ED: Preg Test, Ur: NEGATIVE

## 2020-05-29 LAB — CBC
HCT: 37.7 % (ref 36.0–46.0)
Hemoglobin: 13 g/dL (ref 12.0–15.0)
MCH: 30 pg (ref 26.0–34.0)
MCHC: 34.5 g/dL (ref 30.0–36.0)
MCV: 86.9 fL (ref 80.0–100.0)
Platelets: 280 10*3/uL (ref 150–400)
RBC: 4.34 MIL/uL (ref 3.87–5.11)
RDW: 12.4 % (ref 11.5–15.5)
WBC: 17.4 10*3/uL — ABNORMAL HIGH (ref 4.0–10.5)
nRBC: 0 % (ref 0.0–0.2)

## 2020-05-29 LAB — LIPASE, BLOOD: Lipase: 24 U/L (ref 11–51)

## 2020-05-29 LAB — POCT PREGNANCY, URINE: Preg Test, Ur: NEGATIVE

## 2020-05-29 LAB — SARS CORONAVIRUS 2 BY RT PCR (HOSPITAL ORDER, PERFORMED IN ~~LOC~~ HOSPITAL LAB): SARS Coronavirus 2: NEGATIVE

## 2020-05-29 SURGERY — APPENDECTOMY, ROBOT-ASSISTED, LAPAROSCOPIC
Anesthesia: General | Site: Abdomen

## 2020-05-29 MED ORDER — ACETAMINOPHEN 10 MG/ML IV SOLN
INTRAVENOUS | Status: AC
  Filled 2020-05-29: qty 100

## 2020-05-29 MED ORDER — LACTATED RINGERS IV BOLUS
1000.0000 mL | Freq: Once | INTRAVENOUS | Status: AC
  Administered 2020-05-29: 1000 mL via INTRAVENOUS

## 2020-05-29 MED ORDER — DEXAMETHASONE SODIUM PHOSPHATE 10 MG/ML IJ SOLN
INTRAMUSCULAR | Status: DC | PRN
  Administered 2020-05-29: 10 mg via INTRAVENOUS

## 2020-05-29 MED ORDER — PROPOFOL 10 MG/ML IV BOLUS
INTRAVENOUS | Status: DC | PRN
  Administered 2020-05-29: 200 mg via INTRAVENOUS

## 2020-05-29 MED ORDER — FENTANYL CITRATE (PF) 100 MCG/2ML IJ SOLN
INTRAMUSCULAR | Status: AC
  Filled 2020-05-29: qty 2

## 2020-05-29 MED ORDER — ALUM & MAG HYDROXIDE-SIMETH 200-200-20 MG/5ML PO SUSP
30.0000 mL | Freq: Four times a day (QID) | ORAL | Status: DC | PRN
  Administered 2020-05-29 – 2020-05-30 (×3): 30 mL via ORAL
  Filled 2020-05-29 (×3): qty 30

## 2020-05-29 MED ORDER — LACTATED RINGERS IV SOLN: INTRAVENOUS | Status: DC

## 2020-05-29 MED ORDER — ROCURONIUM BROMIDE 10 MG/ML (PF) SYRINGE
PREFILLED_SYRINGE | INTRAVENOUS | Status: AC
  Filled 2020-05-29: qty 10

## 2020-05-29 MED ORDER — ONDANSETRON HCL 4 MG/2ML IJ SOLN: 4.0000 mg | Freq: Once | INTRAMUSCULAR | Status: DC | PRN

## 2020-05-29 MED ORDER — BUPIVACAINE HCL (PF) 0.5 % IJ SOLN
INTRAMUSCULAR | Status: DC | PRN
  Administered 2020-05-29: 10 mL

## 2020-05-29 MED ORDER — OXYCODONE HCL 5 MG PO TABS: 5.0000 mg | ORAL_TABLET | Freq: Once | ORAL | Status: DC | PRN

## 2020-05-29 MED ORDER — LIDOCAINE-EPINEPHRINE (PF) 1 %-1:200000 IJ SOLN
INTRAMUSCULAR | Status: DC | PRN
  Administered 2020-05-29: 10 mL

## 2020-05-29 MED ORDER — MORPHINE SULFATE (PF) 4 MG/ML IV SOLN
4.0000 mg | Freq: Once | INTRAVENOUS | Status: AC
  Administered 2020-05-29: 4 mg via INTRAVENOUS
  Filled 2020-05-29: qty 1

## 2020-05-29 MED ORDER — PIPERACILLIN-TAZOBACTAM 3.375 G IVPB
3.3750 g | Freq: Three times a day (TID) | INTRAVENOUS | Status: DC
  Administered 2020-05-29 – 2020-05-31 (×5): 3.375 g via INTRAVENOUS
  Filled 2020-05-29 (×5): qty 50

## 2020-05-29 MED ORDER — KETOROLAC TROMETHAMINE 30 MG/ML IJ SOLN
INTRAMUSCULAR | Status: AC
  Filled 2020-05-29: qty 1

## 2020-05-29 MED ORDER — HYDROCODONE-ACETAMINOPHEN 5-325 MG PO TABS
1.0000 | ORAL_TABLET | Freq: Four times a day (QID) | ORAL | Status: DC | PRN
  Administered 2020-05-29 – 2020-05-30 (×3): 1 via ORAL
  Filled 2020-05-29 (×3): qty 1

## 2020-05-29 MED ORDER — ONDANSETRON HCL 4 MG/2ML IJ SOLN
4.0000 mg | Freq: Four times a day (QID) | INTRAMUSCULAR | Status: DC | PRN
  Administered 2020-05-29: 4 mg via INTRAVENOUS

## 2020-05-29 MED ORDER — HYDROMORPHONE HCL 1 MG/ML IJ SOLN
1.0000 mg | INTRAMUSCULAR | Status: DC | PRN
  Administered 2020-05-29 – 2020-05-30 (×2): 1 mg via INTRAVENOUS
  Filled 2020-05-29 (×2): qty 1

## 2020-05-29 MED ORDER — MIDAZOLAM HCL 2 MG/2ML IJ SOLN
INTRAMUSCULAR | Status: DC | PRN
  Administered 2020-05-29: 2 mg via INTRAVENOUS

## 2020-05-29 MED ORDER — FENTANYL CITRATE (PF) 100 MCG/2ML IJ SOLN
25.0000 ug | INTRAMUSCULAR | Status: DC | PRN
  Administered 2020-05-29: 50 ug via INTRAVENOUS
  Administered 2020-05-29 (×2): 25 ug via INTRAVENOUS
  Administered 2020-05-29: 50 ug via INTRAVENOUS

## 2020-05-29 MED ORDER — MIDAZOLAM HCL 2 MG/2ML IJ SOLN
INTRAMUSCULAR | Status: AC
  Filled 2020-05-29: qty 2

## 2020-05-29 MED ORDER — PIPERACILLIN-TAZOBACTAM 3.375 G IVPB 30 MIN
3.3750 g | Freq: Once | INTRAVENOUS | Status: AC
  Administered 2020-05-29: 3.375 g via INTRAVENOUS
  Filled 2020-05-29: qty 50

## 2020-05-29 MED ORDER — SUGAMMADEX SODIUM 200 MG/2ML IV SOLN
INTRAVENOUS | Status: DC | PRN
  Administered 2020-05-29: 200 mg via INTRAVENOUS

## 2020-05-29 MED ORDER — LIDOCAINE HCL (PF) 2 % IJ SOLN
INTRAMUSCULAR | Status: AC
  Filled 2020-05-29: qty 5

## 2020-05-29 MED ORDER — ONDANSETRON 4 MG PO TBDP: 4.0000 mg | ORAL_TABLET | Freq: Four times a day (QID) | ORAL | Status: DC | PRN

## 2020-05-29 MED ORDER — ONDANSETRON HCL 4 MG/2ML IJ SOLN
4.0000 mg | Freq: Once | INTRAMUSCULAR | Status: AC
  Administered 2020-05-29: 4 mg via INTRAVENOUS
  Filled 2020-05-29: qty 2

## 2020-05-29 MED ORDER — ROCURONIUM BROMIDE 100 MG/10ML IV SOLN
INTRAVENOUS | Status: DC | PRN
  Administered 2020-05-29: 70 mg via INTRAVENOUS

## 2020-05-29 MED ORDER — ACETAMINOPHEN 10 MG/ML IV SOLN: 1000.0000 mg | Freq: Once | INTRAVENOUS | Status: DC | PRN

## 2020-05-29 MED ORDER — ACETAMINOPHEN 10 MG/ML IV SOLN
INTRAVENOUS | Status: DC | PRN
  Administered 2020-05-29: 1000 mg via INTRAVENOUS

## 2020-05-29 MED ORDER — IOHEXOL 300 MG/ML  SOLN
100.0000 mL | Freq: Once | INTRAMUSCULAR | Status: AC | PRN
  Administered 2020-05-29: 100 mL via INTRAVENOUS

## 2020-05-29 MED ORDER — ENOXAPARIN SODIUM 40 MG/0.4ML ~~LOC~~ SOLN
40.0000 mg | SUBCUTANEOUS | Status: DC
  Administered 2020-05-30 – 2020-05-31 (×2): 40 mg via SUBCUTANEOUS
  Filled 2020-05-29 (×2): qty 0.4

## 2020-05-29 MED ORDER — MENTHOL 3 MG MT LOZG
1.0000 | LOZENGE | OROMUCOSAL | Status: DC | PRN
  Filled 2020-05-29: qty 9

## 2020-05-29 MED ORDER — ONDANSETRON HCL 4 MG/2ML IJ SOLN
INTRAMUSCULAR | Status: AC
  Filled 2020-05-29: qty 2

## 2020-05-29 MED ORDER — OXYCODONE HCL 5 MG/5ML PO SOLN: 5.0000 mg | Freq: Once | ORAL | Status: DC | PRN

## 2020-05-29 MED ORDER — FENTANYL CITRATE (PF) 100 MCG/2ML IJ SOLN
INTRAMUSCULAR | Status: DC | PRN
Start: 2020-05-29 — End: 2020-05-29
  Administered 2020-05-29 (×2): 50 ug via INTRAVENOUS

## 2020-05-29 MED ORDER — DOCUSATE SODIUM 100 MG PO CAPS: 100.0000 mg | ORAL_CAPSULE | Freq: Two times a day (BID) | ORAL | Status: DC | PRN

## 2020-05-29 MED ORDER — LIDOCAINE HCL (CARDIAC) PF 100 MG/5ML IV SOSY
PREFILLED_SYRINGE | INTRAVENOUS | Status: DC | PRN
  Administered 2020-05-29: 100 mg via INTRAVENOUS

## 2020-05-29 MED ORDER — MORPHINE SULFATE (PF) 2 MG/ML IV SOLN
2.0000 mg | INTRAVENOUS | Status: DC | PRN
  Administered 2020-05-29: 2 mg via INTRAVENOUS
  Filled 2020-05-29: qty 1

## 2020-05-29 MED ORDER — DEXAMETHASONE SODIUM PHOSPHATE 10 MG/ML IJ SOLN
INTRAMUSCULAR | Status: AC
  Filled 2020-05-29: qty 1

## 2020-05-29 SURGICAL SUPPLY — 71 items
ANCHOR TIS RET SYS 235ML (MISCELLANEOUS) ×3 IMPLANT
BAG INFUSER PRESSURE 100CC (MISCELLANEOUS) IMPLANT
BLADE SURG SZ11 CARB STEEL (BLADE) ×3 IMPLANT
BULB RESERV EVAC DRAIN JP 100C (MISCELLANEOUS) ×3 IMPLANT
CANISTER SUCT 1200ML W/VALVE (MISCELLANEOUS) ×3 IMPLANT
CANNULA REDUC XI 12-8 STAPL (CANNULA) ×1
CANNULA REDUC XI 12-8MM STAPL (CANNULA) ×1
CANNULA REDUCER 12-8 DVNC XI (CANNULA) ×1 IMPLANT
CHLORAPREP W/TINT 26 (MISCELLANEOUS) ×3 IMPLANT
COVER TIP SHEARS 8 DVNC (MISCELLANEOUS) ×1 IMPLANT
COVER TIP SHEARS 8MM DA VINCI (MISCELLANEOUS) ×2
COVER WAND RF STERILE (DRAPES) IMPLANT
DEFOGGER SCOPE WARMER CLEARIFY (MISCELLANEOUS) ×3 IMPLANT
DERMABOND ADVANCED (GAUZE/BANDAGES/DRESSINGS) ×2
DERMABOND ADVANCED .7 DNX12 (GAUZE/BANDAGES/DRESSINGS) ×1 IMPLANT
DRAIN CHANNEL JP 15F RND 16 (MISCELLANEOUS) ×3 IMPLANT
DRAPE ARM DVNC X/XI (DISPOSABLE) ×4 IMPLANT
DRAPE COLUMN DVNC XI (DISPOSABLE) ×1 IMPLANT
DRAPE DA VINCI XI ARM (DISPOSABLE) ×8
DRAPE DA VINCI XI COLUMN (DISPOSABLE) ×2
ELECT CAUTERY BLADE 6.4 (BLADE) IMPLANT
ELECT REM PT RETURN 9FT ADLT (ELECTROSURGICAL) ×3
ELECTRODE REM PT RTRN 9FT ADLT (ELECTROSURGICAL) ×1 IMPLANT
GLOVE BIOGEL PI IND STRL 7.0 (GLOVE) ×2 IMPLANT
GLOVE BIOGEL PI INDICATOR 7.0 (GLOVE) ×4
GLOVE SURG SYN 6.5 ES PF (GLOVE) ×6 IMPLANT
GOWN STRL REUS W/ TWL LRG LVL3 (GOWN DISPOSABLE) ×3 IMPLANT
GOWN STRL REUS W/TWL LRG LVL3 (GOWN DISPOSABLE) ×6
GRASPER SUT TROCAR 14GX15 (MISCELLANEOUS) IMPLANT
HEMOSTAT SURGICEL 2X3 (HEMOSTASIS) ×3 IMPLANT
IRRIGATOR SUCT 8 DISP DVNC XI (IRRIGATION / IRRIGATOR) IMPLANT
IRRIGATOR SUCTION 8MM XI DISP (IRRIGATION / IRRIGATOR)
IV NS 1000ML (IV SOLUTION)
IV NS 1000ML BAXH (IV SOLUTION) IMPLANT
KIT PINK PAD W/HEAD ARE REST (MISCELLANEOUS) ×3
KIT PINK PAD W/HEAD ARM REST (MISCELLANEOUS) ×1 IMPLANT
KIT TURNOVER KIT A (KITS) ×3 IMPLANT
LABEL OR SOLS (LABEL) IMPLANT
NEEDLE HYPO 22GX1.5 SAFETY (NEEDLE) ×3 IMPLANT
NEEDLE INSUFFLATION 14GA 120MM (NEEDLE) ×3 IMPLANT
OBTURATOR OPTICAL STANDARD 8MM (TROCAR) ×2
OBTURATOR OPTICAL STND 8 DVNC (TROCAR) ×1
OBTURATOR OPTICALSTD 8 DVNC (TROCAR) ×1 IMPLANT
PACK LAP CHOLECYSTECTOMY (MISCELLANEOUS) ×3 IMPLANT
PENCIL ELECTRO HAND CTR (MISCELLANEOUS) ×3 IMPLANT
RELOAD STAPLER 2.5X45 WHT DVNC (STAPLE) ×1 IMPLANT
RELOAD STAPLER 3.5X45 BLU DVNC (STAPLE) ×1 IMPLANT
SEAL CANN UNIV 5-8 DVNC XI (MISCELLANEOUS) ×3 IMPLANT
SEAL XI 5MM-8MM UNIVERSAL (MISCELLANEOUS) ×6
SEALER VESSEL DA VINCI XI (MISCELLANEOUS) ×2
SEALER VESSEL EXT DVNC XI (MISCELLANEOUS) ×1 IMPLANT
SET TUBE SMOKE EVAC HIGH FLOW (TUBING) ×3 IMPLANT
SOLUTION ELECTROLUBE (MISCELLANEOUS) ×3 IMPLANT
SPONGE DRAIN TRACH 4X4 STRL 2S (GAUZE/BANDAGES/DRESSINGS) ×3 IMPLANT
STAPLER 45 DA VINCI SURE FORM (STAPLE) ×2
STAPLER 45 SUREFORM DVNC (STAPLE) ×1 IMPLANT
STAPLER CANNULA SEAL DVNC XI (STAPLE) ×1 IMPLANT
STAPLER CANNULA SEAL XI (STAPLE) ×2
STAPLER RELOAD 2.5X45 WHITE (STAPLE) ×2
STAPLER RELOAD 2.5X45 WHT DVNC (STAPLE) ×1
STAPLER RELOAD 3.5X45 BLU DVNC (STAPLE) ×1
STAPLER RELOAD 3.5X45 BLUE (STAPLE) ×2
SUT ETHILON 3-0 FS-10 30 BLK (SUTURE) ×3
SUT MNCRL AB 4-0 PS2 18 (SUTURE) ×3 IMPLANT
SUT VIC AB 3-0 SH 27 (SUTURE)
SUT VIC AB 3-0 SH 27X BRD (SUTURE) IMPLANT
SUT VICRYL 0 AB UR-6 (SUTURE) ×3 IMPLANT
SUTURE EHLN 3-0 FS-10 30 BLK (SUTURE) ×1 IMPLANT
SYR 30ML LL (SYRINGE) ×3 IMPLANT
SYSTEM WECK SHIELD CLOSURE (TROCAR) IMPLANT
TRAY FOLEY MTR SLVR 16FR STAT (SET/KITS/TRAYS/PACK) IMPLANT

## 2020-05-29 NOTE — Progress Notes (Signed)
When I entered the room to give report to the night nurse the patient was whimpering. She asked to get up to the bathroom and started crying. Mom of the patient became upset with staff upon seeing her daughter crying. I did give her norco. It is not time for morphine. Dr Tonna Boehringer notified. Order changed from morphine to dilaudid. Patient also requested a heating pad. Dr Tonna Boehringer did not want her to have a heating pad due to the risk of infection.

## 2020-05-29 NOTE — ED Triage Notes (Signed)
Pt states she has been having RLQ pain since yesterday- pt states she has been having nausea but no vomiting and denies diarrhea

## 2020-05-29 NOTE — Anesthesia Procedure Notes (Signed)
Procedure Name: Intubation Performed by: Lesle Reek, CRNA Pre-anesthesia Checklist: Patient identified, Timeout performed, Patient being monitored, Suction available and Emergency Drugs available Patient Re-evaluated:Patient Re-evaluated prior to induction Oxygen Delivery Method: Circle system utilized Preoxygenation: Pre-oxygenation with 100% oxygen Induction Type: IV induction and Rapid sequence Laryngoscope Size: Mac and 3 Grade View: Grade I Tube type: Oral Tube size: 7.0 mm Number of attempts: 1 Airway Equipment and Method: Stylet Placement Confirmation: ETT inserted through vocal cords under direct vision,  CO2 detector and positive ETCO2 Secured at: 21 cm Tube secured with: Tape

## 2020-05-29 NOTE — Anesthesia Postprocedure Evaluation (Signed)
Anesthesia Post Note  Patient: Jasmine Hopkins  Procedure(s) Performed: XI ROBOTIC LAPAROSCOPIC ASSISTED APPENDECTOMY (N/A Abdomen)  Patient location during evaluation: PACU Anesthesia Type: General Level of consciousness: awake and alert Pain management: pain level controlled Vital Signs Assessment: post-procedure vital signs reviewed and stable Respiratory status: spontaneous breathing, nonlabored ventilation, respiratory function stable and patient connected to nasal cannula oxygen Cardiovascular status: blood pressure returned to baseline and stable Postop Assessment: no apparent nausea or vomiting Anesthetic complications: no   No complications documented.   Last Vitals:  Vitals:   05/29/20 1814 05/29/20 1917  BP: 127/81 (!) 155/96  Pulse: 69 85  Resp: 16 20  Temp: 37 C 36.9 C  SpO2: 100% 99%    Last Pain:  Vitals:   05/29/20 1917  TempSrc: Oral  PainSc:                  Corinda Gubler
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
## 2020-05-29 NOTE — ED Notes (Signed)
OR tech at bedside to take pt to OR

## 2020-05-29 NOTE — H&P (Signed)
Subjective:   CC: acute appendicitis  HPI:  Jasmine Hopkins is a 19 y.o. female who is consulted by Roxan Hockey for evaluation of  above cc.  Symptoms were first noted 1 day ago. Pain is sharp, worsening, located in RLQ.  Associated with nausea, exacerbated by nothing specific.     Past Medical History:  has a past medical history of Headache(784.0) and MS (multiple sclerosis) (HCC).  Past Surgical History:  has a past surgical history that includes Other surgical history.  Family History: reviewed and not relevant to exam  Social History:  reports that she has never smoked. She has never used smokeless tobacco. She reports current drug use. Drug: Marijuana. She reports that she does not drink alcohol.  Current Medications:  Prior to Admission medications   Medication Sig Start Date End Date Taking? Authorizing Provider  amoxicillin (AMOXIL) 500 MG capsule Take 1 capsule (500 mg total) by mouth 3 (three) times daily. 05/23/20  Yes Irean Hong, MD  ibuprofen (ADVIL) 800 MG tablet Take 1 tablet (800 mg total) by mouth every 8 (eight) hours as needed for moderate pain. 05/23/20  Yes Irean Hong, MD  oxyCODONE-acetaminophen (PERCOCET/ROXICET) 5-325 MG tablet Take 1 tablet by mouth every 4 (four) hours as needed for severe pain. 05/23/20  Yes Irean Hong, MD    Allergies:  Allergies as of 05/29/2020  . (No Known Allergies)    ROS:  General: Denies weight loss, weight gain, fatigue, fevers, chills, and night sweats. Eyes: Denies blurry vision, double vision, eye pain, itchy eyes, and tearing. Ears: Denies hearing loss, earache, and ringing in ears. Nose: Denies sinus pain, congestion, infections, runny nose, and nosebleeds. Mouth/throat: Denies hoarseness, sore throat, bleeding gums, and difficulty swallowing. Heart: Denies chest pain, palpitations, racing heart, irregular heartbeat, leg pain or swelling, and decreased activity tolerance. Respiratory: Denies breathing difficulty, shortness  of breath, wheezing, cough, and sputum. GI: Denies change in appetite, heartburn, vomiting, constipation, diarrhea, and blood in stool. GU: Denies difficulty urinating, pain with urinating, urgency, frequency, blood in urine. Musculoskeletal: Denies joint stiffness, pain, swelling, muscle weakness. Skin: Denies rash, itching, mass, tumors, sores, and boils Neurologic: Denies headache, fainting, dizziness, seizures, numbness, and tingling. Psychiatric: Denies depression, anxiety, difficulty sleeping, and memory loss. Endocrine: Denies heat or cold intolerance, and increased thirst or urination. Blood/lymph: Denies easy bruising, easy bruising, and swollen glands     Objective:     BP 121/72   Pulse 72   Temp 98.9 F (37.2 C) (Oral)   Resp 16   Ht 5\' 4"  (1.626 m)   Wt 88.5 kg   LMP 05/16/2020 Comment: neg preg  SpO2 99%   BMI 33.47 kg/m    Constitutional :  alert, cooperative, appears stated age and no distress  Lymphatics/Throat:  no asymmetry, masses, or scars  Respiratory:  clear to auscultation bilaterally  Cardiovascular:  regular rate and rhythm  Gastrointestinal: soft, no guarding, focal TTP in RLQ.   Musculoskeletal: Steady gait and movement  Skin: Cool and moist  Psychiatric: Normal affect, non-agitated, not confused       LABS:  CMP Latest Ref Rng & Units 05/29/2020 10/11/2018 10/10/2018  Glucose 70 - 99 mg/dL 12/09/2018) 98 790(W)  BUN 6 - 20 mg/dL 10 8 8   Creatinine 0.44 - 1.00 mg/dL 409(B 3.53  Sodium 135 - 145 mmol/L 134(L) 139 137  Potassium 3.5 - 5.1 mmol/L 3.7 4.2 3.3(L)  Chloride 98 - 111 mmol/L 105 110 104  CO2 22 -  32 mmol/L 22 25 23   Calcium 8.9 - 10.3 mg/dL ) 8.3(L) 8.7(L)  Total Protein 6.5 - 8.1 g/dL 7.7 - -  Total Bilirubin 0.3 - 1.2 mg/dL 0.8 - -  Alkaline Phos 38 - 126 U/L 67 - -  AST 15 - 41 U/L 17 - -  ALT 0 - 44 U/L 17 - -   CBC Latest Ref Rng & Units 05/29/2020 10/10/2018 12/31/2013  WBC 4.0 - 10.5 K/uL 17.4(H) 8.1 8.6  Hemoglobin 12.0 -  15.0 g/dL 03/02/2014 47.8 29.5  Hematocrit 36 - 46 % 37.7 40.5 45.3  Platelets 150 - 400 K/uL 280 330 329     RADS: CLINICAL DATA:  RLQ pain x 24hrs, worsened this morning. Elevated WBC. Loss of appetite.  EXAM: CT ABDOMEN AND PELVIS WITH CONTRAST  TECHNIQUE: Multidetector CT imaging of the abdomen and pelvis was performed using the standard protocol following bolus administration of intravenous contrast.  CONTRAST:  62.1 OMNIPAQUE IOHEXOL 300 MG/ML  SOLN  COMPARISON:  None.  FINDINGS: Lower chest: No acute abnormality.  Hepatobiliary: No focal liver abnormality is seen. No gallstones, gallbladder wall thickening, or biliary dilatation.  Pancreas: Unremarkable. No pancreatic ductal dilatation or surrounding inflammatory changes.  Spleen: Normal in size without focal abnormality.  Adrenals/Urinary Tract: Adrenal glands are unremarkable. Kidneys are normal, without renal calculi, focal lesion, or hydronephrosis. Bladder is unremarkable.  Stomach/Bowel: Stomach is within normal limits. There are surgical changes within bowel in the left lower quadrant. The remainder of the bowel is normal in appearance. The appendix is dilated measuring up to 1.3 cm in diameter with mucosal enhancement and surrounding fat stranding. There is a small appendicoliths. No free air or abscess.  Vascular/Lymphatic: No significant vascular findings are present. No enlarged abdominal or pelvic lymph nodes.  Reproductive: Uterus and bilateral adnexa are unremarkable.  Other: No abdominal wall hernia or abnormality. No abdominopelvic ascites.  Musculoskeletal: No acute or significant osseous findings.  IMPRESSION: Acute uncomplicated appendicitis.   Electronically Signed   By: M.D.   On: 05/29/2020 09:44  Assessment:      Acute appendicitis  Plan:      Discussed the risk of surgery including post-op infxn, seroma, hematoma, abscess formation,  chronic pain, poor-delayed wound healing, possible bowel resection, possible ostomy, possible conversion to open procedure, post-op SBO or ileus, and need for additional procedures to address said risks.  The risks of general anesthetic including MI, CVA, sudden death or even reaction to anesthetic medications also discussed. Alternatives include continued observation, or antibiotic treatment.  Benefits include possible symptom relief,   Typical post operative recovery of 3-5 days rest, also discussed.  The patient understands the risks, any and all questions were answered to the patient's satisfaction.  On zosyn for now.  To OR when time available

## 2020-05-29 NOTE — Transfer of Care (Signed)
Immediate Anesthesia Transfer of Care Note  Patient: Jasmine Hopkins  Procedure(s) Performed: XI ROBOTIC LAPAROSCOPIC ASSISTED APPENDECTOMY (N/A Abdomen)  Patient Location: PACU  Anesthesia Type:General  Level of Consciousness: awake, alert  and oriented  Airway & Oxygen Therapy: Patient Spontanous Breathing  Post-op Assessment: Report given to RN  Post vital signs: Reviewed and stable  Last Vitals:  Vitals Value Taken Time  BP 140/73 05/29/20 1701  Temp 37 C 05/29/20 1701  Pulse    Resp 17 05/29/20 1701  SpO2    Vitals shown include unvalidated device data.  Last Pain:  Vitals:   05/29/20 1058  TempSrc:   PainSc: 4          Complications: No complications documented.

## 2020-05-29 NOTE — Progress Notes (Signed)
Patient is relaxed and said pain is much better

## 2020-05-30 ENCOUNTER — Encounter: Payer: Self-pay | Admitting: Surgery

## 2020-05-30 LAB — BASIC METABOLIC PANEL
Anion gap: 7 (ref 5–15)
BUN: 6 mg/dL (ref 6–20)
CO2: 26 mmol/L (ref 22–32)
Calcium: 8.3 mg/dL — ABNORMAL LOW (ref 8.9–10.3)
Chloride: 104 mmol/L (ref 98–111)
Creatinine, Ser: 0.52 mg/dL (ref 0.44–1.00)
GFR calc Af Amer: >60 mL/min (ref >60)
GFR calc non Af Amer: >60 mL/min (ref >60)
Glucose, Bld: 118 mg/dL — ABNORMAL HIGH (ref 70–99)
Potassium: 3.8 mmol/L (ref 3.5–5.1)
Sodium: 137 mmol/L (ref 135–145)

## 2020-05-30 LAB — MAGNESIUM: Magnesium: 2.1 mg/dL (ref 1.7–2.4)

## 2020-05-30 LAB — CBC
HCT: 36.4 % (ref 36.0–46.0)
Hemoglobin: 12 g/dL (ref 12.0–15.0)
MCH: 29.4 pg (ref 26.0–34.0)
MCHC: 33 g/dL (ref 30.0–36.0)
MCV: 89.2 fL (ref 80.0–100.0)
Platelets: 260 10*3/uL (ref 150–400)
RBC: 4.08 MIL/uL (ref 3.87–5.11)
RDW: 12.5 % (ref 11.5–15.5)
WBC: 11 10*3/uL — ABNORMAL HIGH (ref 4.0–10.5)
nRBC: 0 % (ref 0.0–0.2)

## 2020-05-30 LAB — HIV ANTIBODY (ROUTINE TESTING W REFLEX): HIV Screen 4th Generation wRfx: NONREACTIVE

## 2020-05-30 LAB — PHOSPHORUS: Phosphorus: 3.6 mg/dL (ref 2.5–4.6)

## 2020-05-30 MED ORDER — SODIUM CHLORIDE 0.9 % IV SOLN
INTRAVENOUS | Status: DC | PRN
  Administered 2020-05-30 – 2020-05-31 (×2): 250 mL via INTRAVENOUS

## 2020-05-30 MED ORDER — SIMETHICONE 80 MG PO CHEW
80.0000 mg | CHEWABLE_TABLET | Freq: Four times a day (QID) | ORAL | Status: DC | PRN
  Administered 2020-05-30 (×2): 80 mg via ORAL
  Filled 2020-05-30 (×2): qty 1

## 2020-05-30 NOTE — Discharge Instructions (Signed)
Laparoscopic Appendectomy, Care After This sheet gives you information about how to care for yourself after your procedure. Your doctor may also give you more specific instructions. If you have problems or questions, contact your doctor. Follow these instructions at home: Care for cuts from surgery (incisions)   Follow instructions from your doctor about how to take care of your cuts from surgery. Make sure you: ? Wash your hands with soap and water before you change your bandage (dressing). If you cannot use soap and water, use hand sanitizer. ? Change your bandage as told by your doctor. ? Leave stitches (sutures), skin glue, or skin tape (adhesive) strips in place. They may need to stay in place for 2 weeks or longer. If tape strips get loose and curl up, you may trim the loose edges. Do not remove tape strips completely unless your doctor says it is okay.  Do not take baths, swim, or use a hot tub until your doctor says it is okay. OK TO SHOWER 24HRS AFTER YOUR SURGERY.   Check your surgical cut area every day for signs of infection. Check for: ? More redness, swelling, or pain. ? More fluid or blood. ? Warmth. ? Pus or a bad smell. Activity  Do not drive or use heavy machinery while taking prescription pain medicine.  Do not play contact sports until your doctor says it is okay.  Do not drive for 24 hours if you were given a medicine to help you relax (sedative).  Rest as needed. Do not return to work or school until your doctor says it is okay. General instructions .  tylenol and advil as needed for discomfort.  Please alternate between the two every four hours as needed for pain.   .  Use narcotics, if prescribed, only when tylenol and motrin is not enough to control pain. .  325-650mg every 8hrs to max of 3000mg/24hrs (including the 325mg in every norco dose) for the tylenol.   .  Advil up to 800mg per dose every 8hrs as needed for pain.    To prevent or treat constipation  while you are taking prescription pain medicine, your doctor may recommend that you: ? Drink enough fluid to keep your pee (urine) clear or pale yellow. ? Take over-the-counter or prescription medicines. ? Eat foods that are high in fiber, such as fresh fruits and vegetables, whole grains, and beans. ? Limit foods that are high in fat and processed sugars, such as fried and sweet foods. Contact a doctor if:  You develop a rash.  You have more redness, swelling, or pain around your surgical cuts.  You have more fluid or blood coming from your surgical cuts.  Your surgical cuts feel warm to the touch.  You have pus or a bad smell coming from your surgical cuts.  You have a fever.  One or more of your surgical cuts breaks open. Get help right away if:  You have trouble breathing.  You have chest pain.  You have pain that is getting worse in your shoulders.  You faint or feel dizzy when you stand.  You have very bad pain in your belly (abdomen).  You are sick to your stomach (nauseous) for more than one day.  You have throwing up (vomiting) that lasts for more than one day.  You have leg pain. This information is not intended to replace advice given to you by your health care provider. Make sure you discuss any questions you have with your   health care provider. Document Released: 06/27/2008 Document Revised: 04/08/2016 Document Reviewed: 03/06/2016 Elsevier Interactive Patient Education  2019 [employer]   

## 2020-05-30 NOTE — Op Note (Addendum)
Preoperative diagnosis: acute appendicitis  Postoperative diagnosis: Same  Procedure: Robotic assisted laparoscopic appendectomy.  Anesthesia: GETA  Surgeon: Sung Amabile  Wound Classification: clean contaminated  Specimen: Appendix  Complications: None  Estimated Blood Loss: 50 mL   Indications: Patient is a 19 y.o. female  presented with above.  Please see H&P for further details.    FIndings: 1.  Irritated retrocecal appendix   2. No peri-appendiceal abscess or phlegmon 3. Normal anatomy 4. Appendiceal artery ligated and divided with stapler 5. Adequate hemostasis.   Description of procedure: The patient was placed on the operating table in the supine position, left arm tucked. General anesthesia was induced. A time-out was completed verifying correct patient, procedure, site, positioning, and implant(s) and/or special equipment prior to beginning this procedure. The abdomen was prepped and draped in the usual sterile fashion.   Palmer's point located and Veress needle was inserted.  After confirming 2 clicks and a positive saline drop test, gas insufflation was initiated until the abdominal pressure was measured at 15 mmHg.  Afterwards, the Veress needle was removed and a 8 mm port was placed through a periumbilical site using Optiview technique after incision with an 11 blade.  After local was infused, 2 additional incision was made 8 cm apart each side along the left side of the abdominal wall from the initial incision.  An 8 mm port was caudaed and 77mm port cephalad from initial incision, both under direct visualization.  No injuries from trocar placements were noted. The table was placed in the Trendelenburg position with the right side elevated.  Xi robotic platform was then brought to the operative field and docked.  Extensive adhesions noted along the right hand of Toldt.  This was taken down with scissors and an inflamed appendix was eventually identified in the retrocecal  area and elevated after additional lysis of adhesions surrounding the inflamed appendix from the lateral abdominal wall.  Window created at base of appendix in the mesentery.   A blue load linear cutting stapler was then used to divide and staple the base of the appendix. It was reloaded with a vascular cartridge and the mesoappendix was started to be stapled off.  However the increased inflammation caused the mesoappendix to be severely friable, with staples not providing adequate hemostasis.  Bleeding staple line was controlled with cautery using Maryland forcep.  Afterwards, stapler was replaced with the vessel sealer and successful transection of the remaining mesoappendix was accomplished.  Additional inspection of the staple line noted an arterial bleeder, which was again controlled with electrocautery and vessel sealer.  Final inspection of the area of dissection noted no signs of active bleeding after extensive irrigation.  The appendix was placed in an endoscopic retrieval bag and removed.   The appendiceal stump and mesoappendix staple line examined again one last time and hemostasis noted.  Surgicel was placed over the noted arterial bleeder for extra precaution.  No other pathology was identified within pelvis. The 12 mm trocar removed and port site closed with Efx shield using 0 vicryl under direct vision. Remaining trocars were removed under direct vision. No bleeding was noted.The abdomen was allowed to collapse. All skin incisions then closed with subcuticular sutures Monocryl 4-0.  Wounds then dressed with dermabond.  The patient tolerated the procedure well, awakened from anesthesia and was taken to the postanesthesia care unit in satisfactory condition.  Sponge count and instrument count correct at the end of the procedure.

## 2020-05-31 LAB — BASIC METABOLIC PANEL
Anion gap: 7 (ref 5–15)
BUN: 9 mg/dL (ref 6–20)
CO2: 24 mmol/L (ref 22–32)
Calcium: 8 mg/dL — ABNORMAL LOW (ref 8.9–10.3)
Chloride: 107 mmol/L (ref 98–111)
Creatinine, Ser: 0.61 mg/dL (ref 0.44–1.00)
GFR calc Af Amer: >60 mL/min (ref >60)
GFR calc non Af Amer: >60 mL/min (ref >60)
Glucose, Bld: 100 mg/dL — ABNORMAL HIGH (ref 70–99)
Potassium: 3.5 mmol/L (ref 3.5–5.1)
Sodium: 138 mmol/L (ref 135–145)

## 2020-05-31 LAB — CBC
HCT: 34.2 % — ABNORMAL LOW (ref 36.0–46.0)
Hemoglobin: 11.7 g/dL — ABNORMAL LOW (ref 12.0–15.0)
MCH: 30.2 pg (ref 26.0–34.0)
MCHC: 34.2 g/dL (ref 30.0–36.0)
MCV: 88.1 fL (ref 80.0–100.0)
Platelets: 257 10*3/uL (ref 150–400)
RBC: 3.88 MIL/uL (ref 3.87–5.11)
RDW: 12.7 % (ref 11.5–15.5)
WBC: 8.6 10*3/uL (ref 4.0–10.5)
nRBC: 0 % (ref 0.0–0.2)

## 2020-05-31 LAB — MAGNESIUM: Magnesium: 1.9 mg/dL (ref 1.7–2.4)

## 2020-05-31 LAB — PHOSPHORUS: Phosphorus: 2.2 mg/dL — ABNORMAL LOW (ref 2.5–4.6)

## 2020-05-31 MED ORDER — HYDROCODONE-ACETAMINOPHEN 5-325 MG PO TABS: 1.0000 | ORAL_TABLET | Freq: Four times a day (QID) | ORAL | 0 refills | Status: DC | PRN

## 2020-05-31 MED ORDER — IBUPROFEN 800 MG PO TABS: 800.0000 mg | ORAL_TABLET | Freq: Three times a day (TID) | ORAL | 0 refills | Status: DC | PRN

## 2020-05-31 MED ORDER — ACETAMINOPHEN 325 MG PO TABS: 650.0000 mg | ORAL_TABLET | Freq: Three times a day (TID) | ORAL | 0 refills | Status: AC | PRN

## 2020-05-31 MED ORDER — DOCUSATE SODIUM 100 MG PO CAPS: 100.0000 mg | ORAL_CAPSULE | Freq: Two times a day (BID) | ORAL | 0 refills | Status: AC | PRN

## 2020-05-31 NOTE — Progress Notes (Signed)
Pt discharged per MD order. IV removed. Discharge instructions reviewed with pt. Pt verbalized understanding. Work note given to pt. Pt taken to car in wheelchair by staff.

## 2020-05-31 NOTE — Discharge Summary (Signed)
Physician Discharge Summary  Patient ID: Jasmine Hopkins MRN: [id number] DOB/AGE: 2001-08-16 19 y.o.  Admit date: 05/29/2020 Discharge date: 05/31/20  Admission Diagnoses: Acute appendicitis  Discharge Diagnoses:  Same as above  Discharged Condition: good  Hospital Course: Admitted for above.  Underwent robotic assisted lap appendectomy.  Please see op note for further details.  Postop advance diet to regular by day 1 but had some burping, flatus but no actual BM.  After 1 more night for obs.  Morning of discharge, reported a bowel movement pain continuing to improve and tolerating a regular diet, therefore deemed ready for discharge.  She will continue antibiotics prescribed her for her dental pain on a previous emergency room visit.  Consults: None  Discharge Exam: Blood pressure 117/77, pulse 72, temperature 98.2 F (36.8 C), temperature source Oral, resp. rate 20, height 5\' 4"  (1.626 m), weight 88.5 kg, last menstrual period 05/16/2020, SpO2 99 %. General appearance: alert, cooperative and no distress GI: Soft, no guarding, focal tenderness to palpation right lower quadrant and epigastric area.  Appropriate.  Disposition:  Discharge disposition: 01-Home or Self Care       Discharge Instructions    Discharge patient   Complete by: As directed    Discharge disposition: 01-Home or Self Care   Discharge patient date: 05/31/2020     Allergies as of 05/31/2020   No Known Allergies     Medication List    STOP taking these medications   oxyCODONE-acetaminophen 5-325 MG tablet Commonly known as: PERCOCET/ROXICET     TAKE these medications   acetaminophen 325 MG tablet Commonly known as: Tylenol Take 2 tablets (650 mg total) by mouth every 8 (eight) hours as needed for mild pain.   amoxicillin 500 MG capsule Commonly known as: AMOXIL Take 1 capsule (500 mg total) by mouth 3 (three) times daily.   docusate sodium 100 MG capsule Commonly known as: Colace Take 1 capsule  (100 mg total) by mouth 2 (two) times daily as needed for up to 10 days for mild constipation.   HYDROcodone-acetaminophen 5-325 MG tablet Commonly known as: Norco Take 1 tablet by mouth every 6 (six) hours as needed for up to 6 doses for moderate pain.   ibuprofen 800 MG tablet Commonly known as: ADVIL Take 1 tablet (800 mg total) by mouth every 8 (eight) hours as needed for moderate pain. What changed: Another medication with the same name was added. Make sure you understand how and when to take each.   ibuprofen 800 MG tablet Commonly known as: ADVIL Take 1 tablet (800 mg total) by mouth every 8 (eight) hours as needed for mild pain or moderate pain. What changed: You were already taking a medication with the same name, and this prescription was added. Make sure you understand how and when to take each.       Follow-up Information    06/02/2020, Lariyah Shetterly, DO Follow up in 2 week(s).   Specialty: Surgery Why: post op lap appy Contact information: [address] [place] [phone]                Total time spent arranging discharge was >37min. Signed: 31m 05/31/2020, 8:16 AM

## 2020-06-01 LAB — SURGICAL PATHOLOGY

## 2020-06-18 DIAGNOSIS — R1012 Left upper quadrant pain: Secondary | ICD-10-CM | POA: Insufficient documentation

## 2020-06-19 ENCOUNTER — Emergency Department
Admission: EM | Admit: 2020-06-19 | Discharge: 2020-06-19 | Disposition: A | Discharge status: Home / Self Care | Payer: Medicaid Other | Attending: Emergency Medicine | Admitting: Emergency Medicine

## 2020-06-19 ENCOUNTER — Other Ambulatory Visit: Payer: Self-pay

## 2020-06-19 ENCOUNTER — Encounter: Payer: Self-pay | Admitting: Emergency Medicine

## 2020-06-19 DIAGNOSIS — R1012 Left upper quadrant pain: Secondary | ICD-10-CM

## 2020-06-19 LAB — CBC WITH DIFFERENTIAL/PLATELET
Abs Immature Granulocytes: 0.01 10*3/uL (ref 0.00–0.07)
Basophils Absolute: 0.1 10*3/uL (ref 0.0–0.1)
Basophils Relative: 1 %
Eosinophils Absolute: 0.2 10*3/uL (ref 0.0–0.5)
Eosinophils Relative: 4 %
HCT: 36.1 % (ref 36.0–46.0)
Hemoglobin: 12.6 g/dL (ref 12.0–15.0)
Immature Granulocytes: 0 %
Lymphocytes Relative: 43 %
Lymphs Abs: 2.7 10*3/uL (ref 0.7–4.0)
MCH: 29.6 pg (ref 26.0–34.0)
MCHC: 34.9 g/dL (ref 30.0–36.0)
MCV: 84.9 fL (ref 80.0–100.0)
Monocytes Absolute: 0.8 10*3/uL (ref 0.1–1.0)
Monocytes Relative: 12 %
Neutro Abs: 2.6 10*3/uL (ref 1.7–7.7)
Neutrophils Relative %: 40 %
Platelets: 283 10*3/uL (ref 150–400)
RBC: 4.25 MIL/uL (ref 3.87–5.11)
RDW: 12.7 % (ref 11.5–15.5)
WBC: 6.4 10*3/uL (ref 4.0–10.5)
nRBC: 0 % (ref 0.0–0.2)

## 2020-06-19 LAB — URINALYSIS, ROUTINE W REFLEX MICROSCOPIC
Bilirubin Urine: NEGATIVE
Glucose, UA: NEGATIVE mg/dL
Hgb urine dipstick: NEGATIVE
Ketones, ur: NEGATIVE mg/dL
Leukocytes,Ua: NEGATIVE
Nitrite: NEGATIVE
Protein, ur: NEGATIVE mg/dL
Specific Gravity, Urine: 1.025 (ref 1.005–1.030)
pH: 5 (ref 5.0–8.0)

## 2020-06-19 LAB — COMPREHENSIVE METABOLIC PANEL
ALT: 23 U/L (ref 0–44)
AST: 22 U/L (ref 15–41)
Albumin: 4.1 g/dL (ref 3.5–5.0)
Alkaline Phosphatase: 68 U/L (ref 38–126)
Anion gap: 8 (ref 5–15)
BUN: 9 mg/dL (ref 6–20)
CO2: 23 mmol/L (ref 22–32)
Calcium: 8.8 mg/dL — ABNORMAL LOW (ref 8.9–10.3)
Chloride: 105 mmol/L (ref 98–111)
Creatinine, Ser: 0.6 mg/dL (ref 0.44–1.00)
GFR calc Af Amer: >60 mL/min (ref >60)
GFR calc non Af Amer: >60 mL/min (ref >60)
Glucose, Bld: 109 mg/dL — ABNORMAL HIGH (ref 70–99)
Potassium: 3.3 mmol/L — ABNORMAL LOW (ref 3.5–5.1)
Sodium: 136 mmol/L (ref 135–145)
Total Bilirubin: 0.4 mg/dL (ref 0.3–1.2)
Total Protein: 7.6 g/dL (ref 6.5–8.1)

## 2020-06-19 NOTE — ED Triage Notes (Signed)
Pt reports she had appendectomy 2 weeks ago reports ever since then has LUQ and LLQ pain specially when she sneezes. Pt denies any urinary symptoms, pt talks in complete sentences no respiratory distress noted

## 2020-06-19 NOTE — ED Notes (Signed)
Patient verbalizes understanding of discharge instructions. Opportunity for questioning and answers were provided. Armband removed by staff, pt discharged from ED. Ambulated out to lobby  
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
## 2020-09-11 ENCOUNTER — Emergency Department
Admission: EM | Admit: 2020-09-11 | Discharge: 2020-09-11 | Disposition: A | Discharge status: Home / Self Care | Payer: Medicaid Other | Attending: Emergency Medicine | Admitting: Emergency Medicine

## 2020-09-11 ENCOUNTER — Other Ambulatory Visit: Payer: Self-pay

## 2020-09-11 ENCOUNTER — Encounter: Payer: Self-pay | Admitting: Emergency Medicine

## 2020-09-11 DIAGNOSIS — K029 Dental caries, unspecified: Secondary | ICD-10-CM

## 2020-09-11 MED ORDER — TRAMADOL HCL 50 MG PO TABS: 50.0000 mg | ORAL_TABLET | Freq: Four times a day (QID) | ORAL | 0 refills | Status: DC | PRN

## 2020-09-11 MED ORDER — AMOXICILLIN 500 MG PO CAPS: 500.0000 mg | ORAL_CAPSULE | Freq: Three times a day (TID) | ORAL | 0 refills | Status: DC

## 2020-09-11 MED ORDER — IBUPROFEN 600 MG PO TABS
600.0000 mg | ORAL_TABLET | Freq: Three times a day (TID) | ORAL | 0 refills | Status: DC | PRN
Start: 2020-09-11 — End: 2020-10-16

## 2020-09-11 NOTE — ED Notes (Signed)
Pt c/o right lower dental pain that got worse in the last 3 days. Right lower molar is dark grey in appearance, appears red and slightly swollen in the surrounding tissue. Pt c/o tenderness, states she took last oxycodone today. Pt states she is unable to see dentist until insurance starts.

## 2020-09-11 NOTE — ED Triage Notes (Signed)
Pt reports right lower jaw pain for the last 3 days.
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
## 2020-09-11 NOTE — Discharge Instructions (Signed)
Follow discharge care instruction take medication as directed.  Try to follow-up for list of dental clinics listed in your discharge care instructions. OPTIONS FOR DENTAL FOLLOW UP CARE  [place] Department of Health and Human Services - Local Safety Net Dental Clinics [email or web address]   [facility] [phone])  [facility] [phone])  [place] [phone] ext [phone])  [facility] [phone])  [facility] [phone]) This clinic caters to the indigent population and is on a lottery system. Location: [employer] [facility], [employer], [address], [place] Clinic Hours: Wednesdays from 6pm - 9pm, patients seen by a lottery system. For dates, call or go to [email or web address] Services: Cleanings, fillings and simple extractions. Payment Options: DENTAL WORK IS FREE OF CHARGE. Bring proof of income or support. Best way to get seen: Arrive at 5:15 pm - this is a lottery, NOT first come/first serve, so arriving earlier will not increase your chances of being seen.     [facility] [phone] Select option 1 for emergencies   Location: [facility], [place], [address], [place] Clinic Hours: No walk-ins accepted - call the day before to schedule an appointment. Check in times are 9:30 am and 1:30 pm. Services: Simple extractions, temporary fillings, pulpectomy/pulp debridement, uncomplicated abscess drainage. Payment Options: PAYMENT IS DUE AT THE TIME OF SERVICE.  Fee is usually $100-200, additional surgical procedures (e.g. abscess drainage) may be extra. Cash, checks, Visa/MasterCard accepted.  Can file Medicaid if patient is covered for dental - patient should call case worker to check. No discount for [facility] patients. Best way to get seen: MUST call the day  before and get onto the schedule. Can usually be seen the next 1-2 days. No walk-ins accepted.     [facility] [phone]   Location: [facility], [address] [place] Clinic Hours: M, W, Th, F 8am or 1:30pm, Tues 9a or 1:30 - first come/first served. Services: Simple extractions, temporary fillings, uncomplicated abscess drainage.  You do not need to be an [facility] resident. Payment Options: PAYMENT IS DUE AT THE TIME OF SERVICE. Dental insurance, otherwise sliding scale - bring proof of income or support. Depending on income and treatment needed, cost is usually $50-200. Best way to get seen: Arrive early as it is first come/first served.     [facility] [phone]   Location: [address] Clinic Hours: Mon-Thu 8a-5p Services: Most basic dental services including extractions and fillings. Payment Options: PAYMENT IS DUE AT THE TIME OF SERVICE. Sliding scale, up to 50% off - bring proof if income or support. Medicaid with dental option accepted. Best way to get seen: Call to schedule an appointment, can usually be seen within 2 weeks OR they will try to see walk-ins - show up at 8a or 2p (you may have to wait).     [facility] [phone] [place] RESIDENTS ONLY   Location: [facility], [address], [place], [place] [address] Clinic Hours: By appointment only. Monday - Thursday 8am-5pm, Friday 8am-12pm Services: Cleanings, fillings, extractions. Payment Options: PAYMENT IS DUE AT THE TIME OF SERVICE. Cash, Visa or MasterCard. Sliding scale - $30 minimum per service. Best way to get seen: Come in to office, complete packet and make an appointment - need proof of income or support monies for each household member and proof of [facility] residence. Usually  takes about a month to get in.     [facility] [phone]   Location: [address], [place] Clinic Hours: Walk-in Urgent Care Dental Services are offered Monday-Friday mornings only. The numbers of emergencies accepted daily is limited to the number of providers available. Maximum 15 - Mondays, Wednesdays & Thursdays Maximum 10 - Tuesdays & Fridays Services: You do not need to be a [facility] resident to be seen for a dental emergency. Emergencies are defined as pain, swelling, abnormal bleeding, or dental trauma. Walkins will receive x-rays if needed. NOTE: Dental cleaning is not an emergency. Payment Options: PAYMENT IS DUE AT THE TIME OF SERVICE. Minimum co-pay is $40.00 for uninsured patients. Minimum co-pay is $3.00 for Medicaid with dental coverage. Dental Insurance is accepted and must be presented at time of visit. Medicare does not cover dental. Forms of payment: Cash, credit card, checks. Best way to get seen: If not previously registered with the clinic, walk-in dental registration begins at 7:15 am and is on a first come/first serve basis. If previously registered with the clinic, call to make an appointment.     [facility] [place] ONLY   Location: [address] [place], [place] Clinic Hours: Mon-Thu 10a-2p Services: Extractions only! Payment Options: FREE (donations accepted) - bring proof of income or support Best way to get seen: Call and schedule an appointment OR come at 8am on the 1st Monday of every month (except for holidays) when it is first come/first served.     [facility] [phone]   Location: [place], [place] Clinic Hours: Friday mornings Services, Payment Options, Best way to get seen: Call for info

## 2020-10-16 ENCOUNTER — Other Ambulatory Visit: Payer: Self-pay

## 2020-10-16 ENCOUNTER — Emergency Department
Admission: EM | Admit: 2020-10-16 | Discharge: 2020-10-16 | Disposition: A | Discharge status: Home / Self Care | Payer: HRSA Program | Attending: Emergency Medicine | Admitting: Emergency Medicine

## 2020-10-16 ENCOUNTER — Emergency Department: Payer: HRSA Program

## 2020-10-16 DIAGNOSIS — U071 COVID-19: Secondary | ICD-10-CM | POA: Diagnosis not present

## 2020-10-16 DIAGNOSIS — R519 Headache, unspecified: Secondary | ICD-10-CM | POA: Diagnosis present

## 2020-10-16 LAB — URINALYSIS, COMPLETE (UACMP) WITH MICROSCOPIC
Bilirubin Urine: NEGATIVE
Glucose, UA: NEGATIVE mg/dL
Hgb urine dipstick: NEGATIVE
Ketones, ur: NEGATIVE mg/dL
Leukocytes,Ua: NEGATIVE
Nitrite: NEGATIVE
Protein, ur: NEGATIVE mg/dL
Specific Gravity, Urine: 1.023 (ref 1.005–1.030)
pH: 5 (ref 5.0–8.0)

## 2020-10-16 LAB — POC URINE PREG, ED: Preg Test, Ur: NEGATIVE

## 2020-10-16 LAB — RESP PANEL BY RT-PCR (FLU A&B, COVID) ARPGX2
Influenza A by PCR: NEGATIVE
Influenza B by PCR: NEGATIVE
SARS Coronavirus 2 by RT PCR: POSITIVE — AB

## 2020-10-16 MED ORDER — ACETAMINOPHEN 325 MG PO TABS
650.0000 mg | ORAL_TABLET | Freq: Once | ORAL | Status: AC | PRN
  Administered 2020-10-16: 650 mg via ORAL
  Filled 2020-10-16: qty 2

## 2020-10-16 NOTE — ED Triage Notes (Signed)
Patient reports fever and headache x 1 day. Dx with COVID on 10/01/20. Reports generalized back pain. Denies cough or UTI symptoms.

## 2020-10-16 NOTE — Discharge Instructions (Signed)
Follow-up with your doctor by phone if any continued problems.  Return to the emergency department for any severe worsening of your symptoms such as difficulty breathing or shortness of breath.  Take Tylenol or ibuprofen as needed for fever, headache and body aches.  Your COVID test is positive.  You should quarantine for the next 10 days since you're not vaccinated.  Also notify contacts that you've been around to let them know that you have tested positive for COVID.
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
## 2021-02-05 ENCOUNTER — Other Ambulatory Visit: Payer: Self-pay

## 2021-02-05 ENCOUNTER — Emergency Department
Admission: EM | Admit: 2021-02-05 | Discharge: 2021-02-05 | Disposition: A | Discharge status: Home / Self Care | Payer: Medicaid Other | Attending: Emergency Medicine | Admitting: Emergency Medicine

## 2021-02-05 ENCOUNTER — Encounter: Payer: Self-pay | Admitting: Emergency Medicine

## 2021-02-05 DIAGNOSIS — K047 Periapical abscess without sinus: Secondary | ICD-10-CM | POA: Insufficient documentation

## 2021-02-05 MED ORDER — IBUPROFEN 600 MG PO TABS: 600.0000 mg | ORAL_TABLET | Freq: Four times a day (QID) | ORAL | 0 refills | Status: DC | PRN

## 2021-02-05 MED ORDER — AMOXICILLIN 500 MG PO CAPS: 500.0000 mg | ORAL_CAPSULE | Freq: Three times a day (TID) | ORAL | 0 refills | Status: DC

## 2021-02-05 MED ORDER — AMOXICILLIN 500 MG PO CAPS
500.0000 mg | ORAL_CAPSULE | Freq: Once | ORAL | Status: AC
  Administered 2021-02-05: 500 mg via ORAL
  Filled 2021-02-05: qty 1

## 2021-02-05 MED ORDER — IBUPROFEN 800 MG PO TABS
800.0000 mg | ORAL_TABLET | Freq: Once | ORAL | Status: AC
  Administered 2021-02-05: 800 mg via ORAL
  Filled 2021-02-05: qty 1
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
## 2021-02-05 NOTE — ED Triage Notes (Signed)
Pt arrived via POV with reports of R side dental pain, pt states the pain as been present for 4 days.  Pt has dentist. Pt states she has a cracked tooth

## 2021-02-05 NOTE — Discharge Instructions (Addendum)
You were seen today for dental pain.  This appears to be an abscessed tooth.  I have given you prescription for anti-inflammatories to take every 8 hours as needed with food.  Have also given you prescription for antibiotics to take 3 times daily for the next 10 days.  Please rinse your mouth with salt water daily.  Follow-up with your dentist

## 2021-06-11 ENCOUNTER — Other Ambulatory Visit: Payer: Self-pay

## 2021-06-11 ENCOUNTER — Emergency Department
Admission: EM | Admit: 2021-06-11 | Discharge: 2021-06-11 | Disposition: A | Discharge status: Home / Self Care | Payer: Medicaid Other | Attending: Emergency Medicine | Admitting: Emergency Medicine

## 2021-06-11 DIAGNOSIS — J029 Acute pharyngitis, unspecified: Secondary | ICD-10-CM | POA: Insufficient documentation

## 2021-06-11 DIAGNOSIS — K047 Periapical abscess without sinus: Secondary | ICD-10-CM | POA: Insufficient documentation

## 2021-06-11 MED ORDER — IBUPROFEN 400 MG PO TABS
400.0000 mg | ORAL_TABLET | Freq: Once | ORAL | Status: AC
  Administered 2021-06-11: 400 mg via ORAL
  Filled 2021-06-11: qty 1

## 2021-06-11 MED ORDER — AMOXICILLIN-POT CLAVULANATE 875-125 MG PO TABS: 1.0000 | ORAL_TABLET | Freq: Two times a day (BID) | ORAL | 0 refills | Status: AC

## 2021-06-11 MED ORDER — LIDOCAINE-EPINEPHRINE (PF) 2 %-1:200000 IJ SOLN
20.0000 mL | Freq: Once | INTRAMUSCULAR | Status: DC
  Filled 2021-06-11: qty 20

## 2021-06-11 MED ORDER — LIDOCAINE-EPINEPHRINE 2 %-1:100000 IJ SOLN
1.7000 mL | Freq: Once | INTRAMUSCULAR | Status: DC
  Filled 2021-06-11: qty 1.7

## 2021-06-11 MED ORDER — AMOXICILLIN-POT CLAVULANATE 875-125 MG PO TABS
1.0000 | ORAL_TABLET | Freq: Once | ORAL | Status: AC
  Administered 2021-06-11: 1 via ORAL
  Filled 2021-06-11: qty 1
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
## 2021-06-11 NOTE — Discharge Instructions (Signed)
You have an infected tooth which you need to see a dentist for.  Please take the antibiotic twice daily for the next 10 days

## 2021-06-11 NOTE — ED Triage Notes (Signed)
Pt to ED for dental pain/abscess to right lower gum that is reoccurring. States took antibiotics that friend gave her Also states while she is here wants to get throat looked at because it has been sore intermittently.  NAD noted

## 2021-09-14 ENCOUNTER — Ambulatory Visit: Payer: Medicaid Other

## 2021-09-15 ENCOUNTER — Emergency Department
Admission: EM | Admit: 2021-09-15 | Discharge: 2021-09-15 | Disposition: A | Discharge status: Home / Self Care | Payer: Medicaid Other | Attending: Student in an Organized Health Care Education/Training Program | Admitting: Student in an Organized Health Care Education/Training Program

## 2021-09-15 ENCOUNTER — Encounter: Payer: Self-pay | Admitting: Emergency Medicine

## 2021-09-15 ENCOUNTER — Other Ambulatory Visit: Payer: Self-pay

## 2021-09-15 DIAGNOSIS — K0889 Other specified disorders of teeth and supporting structures: Secondary | ICD-10-CM | POA: Insufficient documentation

## 2021-09-15 DIAGNOSIS — K029 Dental caries, unspecified: Secondary | ICD-10-CM

## 2021-09-15 MED ORDER — AMOXICILLIN 875 MG PO TABS: 875.0000 mg | ORAL_TABLET | Freq: Two times a day (BID) | ORAL | 0 refills | Status: DC

## 2021-09-15 MED ORDER — IBUPROFEN 600 MG PO TABS: 600.0000 mg | ORAL_TABLET | Freq: Three times a day (TID) | ORAL | 0 refills | Status: DC | PRN

## 2021-09-15 NOTE — Discharge Instructions (Addendum)
Call make an appointment with an dentist listed on your discharge papers.  Also be aware that the dental clinic at [facility] has walk-in hours.  The dental cavity is large enough that it will continue giving you problems unless you have this taken care of.  Begin taking antibiotics and take until completely finished.  Avoid eating or chewing on the right side of your mouth until your dental pain has disappeared.  OPTIONS FOR DENTAL FOLLOW UP CARE  [place] Department of Health and Human Services - Local Safety Net Dental Clinics [email or web address]   [facility] [phone])  [facility]arl Ma [phone])  [place] [phone])  [facility] [phone])  [facility] [phone]) This clinic caters to the indigent population and is on a lottery system. Location: [employer] [facility], [employer], [address], [place] Clinic Hours: Wednesdays from 6pm - 9pm, patients seen by a lottery system. For dates, call or go to [email or web address] Services: Cleanings, fillings and simple extractions. Payment Options: DENTAL WORK IS FREE OF CHARGE. Bring proof of income or support. Best way to get seen: Arrive at 5:15 pm - this is a lottery, NOT first come/first serve, so arriving earlier will not increase your chances of being seen.     [facility] [phone] Select option 1 for emergencies   Location: [facility], [place], [address], [place] Clinic Hours: No walk-ins accepted - call the day before to schedule an appointment. Check in times are 9:30 am and 1:30 pm. Services: Simple extractions, temporary fillings, pulpectomy/pulp debridement, uncomplicated abscess drainage. Payment Options: PAYMENT IS DUE AT THE TIME OF SERVICE.  Fee is usually $100-200, additional surgical  procedures (e.g. abscess drainage) may be extra. Cash, checks, Visa/MasterCard accepted.  Can file Medicaid if patient is covered for dental - patient should call case worker to check. No discount for [facility] patients. Best way to get seen: MUST call the day before and get onto the schedule. Can usually be seen the next 1-2 days. No walk-ins accepted.     [facility] [phone]   Location: [facility], [address], [place] Clinic Hours: M, W, Th, F 8am or 1:30pm, Tues 9a or 1:30 - first come/first served. Services: Simple extractions, temporary fillings, uncomplicated abscess drainage.  You do not need to be an [facility] resident. Payment Options: PAYMENT IS DUE AT THE TIME OF SERVICE. Dental insurance, otherwise sliding scale - bring proof of income or support. Depending on income and treatment needed, cost is usually $50-200. Best way to get seen: Arrive early as it is first come/first served.     [facility] [phone]   Location: [address] Clinic Hours: Mon-Thu 8a-5p Services: Most basic dental services including extractions and fillings. Payment Options: PAYMENT IS DUE AT THE TIME OF SERVICE. Sliding scale, up to 50% off - bring proof if income or support. Medicaid with dental option accepted. Best way to get seen: Call to schedule an appointment, can usually be seen within 2 weeks OR they will try to see walk-ins - show up at 8a or 2p (you may have to wait).     [facility] [phone] [place] RESIDENTS ONLY   Location: [facility], [address] [place], [place] [address] Clinic Hours: By appointment only. Monday - Thursday 8am-5pm, Friday 8am-12pm Services: Cleanings, fillings, extractions. Payment Options: PAYMENT  IS DUE AT THE TIME OF SERVICE. Cash, Visa or MasterCard. Sliding scale - $30 minimum per  service. Best way to get seen: Come in to office, complete packet and make an appointment - need proof of income or support monies for each household member and proof of [facility] residence. Usually takes about a month to get in.     [facility] [phone]   Location: [address], [place] Clinic Hours: Walk-in Urgent Care Dental Services are offered Monday-Friday mornings only. The numbers of emergencies accepted daily is limited to the number of providers available. Maximum 15 - Mondays, Wednesdays & Thursdays Maximum 10 - Tuesdays & Fridays Services: You do not need to be a [place] resident to be seen for a dental emergency. Emergencies are defined as pain, swelling, abnormal bleeding, or dental trauma. Walkins will receive x-rays if needed. NOTE: Dental cleaning is not an emergency. Payment Options: PAYMENT IS DUE AT THE TIME OF SERVICE. Minimum co-pay is $40.00 for uninsured patients. Minimum co-pay is $3.00 for Medicaid with dental coverage. Dental Insurance is accepted and must be presented at time of visit. Medicare does not cover dental. Forms of payment: Cash, credit card, checks. Best way to get seen: If not previously registered with the clinic, walk-in dental registration begins at 7:15 am and is on a first come/first serve basis. If previously registered with the clinic, call to make an appointment.     [facility] [place] ONLY   Location: [address] [place], [place] Clinic Hours: Mon-Thu 10a-2p Services: Extractions only! Payment Options: FREE (donations accepted) - bring proof of income or support Best way to get seen: Call and schedule an appointment OR come at 8am on the 1st Monday of every month (except for holidays) when it is first come/first served.     [facility] [phone]   Location: [place], [place] Clinic Hours: Friday  mornings Services, Payment Options, Best way to get seen: Call for info
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
## 2021-09-15 NOTE — ED Triage Notes (Signed)
Pt comes into the ED via POV c/o right lower dental pain that is now causing right ear pain.  Pt in NAD at this time with even and unlabored respirations.

## 2021-09-29 ENCOUNTER — Ambulatory Visit: Payer: Medicaid Other

## 2021-10-05 ENCOUNTER — Ambulatory Visit: Payer: Medicaid Other

## 2022-03-13 ENCOUNTER — Encounter: Payer: Self-pay | Admitting: Emergency Medicine

## 2022-03-13 ENCOUNTER — Emergency Department
Admission: EM | Admit: 2022-03-13 | Discharge: 2022-03-13 | Disposition: A | Discharge status: Home / Self Care | Payer: BC Managed Care – PPO | Attending: Emergency Medicine | Admitting: Emergency Medicine

## 2022-03-13 ENCOUNTER — Other Ambulatory Visit: Payer: Self-pay

## 2022-03-13 DIAGNOSIS — U071 COVID-19: Secondary | ICD-10-CM | POA: Insufficient documentation

## 2022-03-13 DIAGNOSIS — R059 Cough, unspecified: Secondary | ICD-10-CM | POA: Diagnosis not present

## 2022-03-13 DIAGNOSIS — H73893 Other specified disorders of tympanic membrane, bilateral: Secondary | ICD-10-CM | POA: Insufficient documentation

## 2022-03-13 LAB — RESP PANEL BY RT-PCR (FLU A&B, COVID) ARPGX2
Influenza A by PCR: NEGATIVE
Influenza B by PCR: NEGATIVE
SARS Coronavirus 2 by RT PCR: POSITIVE — AB

## 2022-03-13 LAB — GROUP A STREP BY PCR: Group A Strep by PCR: NOT DETECTED

## 2022-03-13 MED ORDER — ACETAMINOPHEN 325 MG PO TABS
650.0000 mg | ORAL_TABLET | Freq: Once | ORAL | Status: AC
  Administered 2022-03-13: 650 mg via ORAL
  Filled 2022-03-13: qty 2

## 2022-03-13 NOTE — ED Triage Notes (Signed)
Pt presents via POV with c/o runny nose, fever, and sore throat that began about 2 days ago. Pt reports brother recently had similar symptoms. Pt reports taking mucinex before getting here. Pt denies cough, N/V/D.

## 2022-03-13 NOTE — Discharge Instructions (Signed)
Alternate Tylenol and Ibuprofen alternating for body aches and fever.

## 2022-03-13 NOTE — ED Provider Notes (Signed)
[facility] Provider Note  Patient Contact: 11:08 PM (approximate)   History   Sore Throat   HPI  Jasmine Hopkins is a 21 y.o. female presents to the emergency department with rhinorrhea, nasal congestion, nonproductive cough and low-grade fever for the past 2 to 3 days.  Patient has had no vomiting or diarrhea.  No chest pain, chest tightness or abdominal pain.      Physical Exam   Triage Vital Signs: ED Triage Vitals  Enc Vitals Group     BP 03/13/22 2000 126/75     Pulse Rate 03/13/22 2000 72     Resp 03/13/22 2000 17     Temp 03/13/22 2000 99.4 F (37.4 C)     Temp Source 03/13/22 2255 Oral     SpO2 03/13/22 2000 98 %     Weight 03/13/22 2003 180 lb (81.6 kg)     Height 03/13/22 2003 5\' 4"  (1.626 m)     Head Circumference --      Peak Flow --      Pain Score 03/13/22 2001 6     Pain Loc --      Pain Edu? --      Excl. in GC? --     Most recent vital signs: Vitals:   03/13/22 2000 03/13/22 2255  BP: 126/75 114/77  Pulse: 72 84  Resp: 17 14  Temp: 99.4 F (37.4 C) 100.3 F (37.9 C)  SpO2: 98% 99%     Constitutional: Alert and oriented. Patient is lying supine. Eyes: Conjunctivae are normal. PERRL. EOMI. Head: Atraumatic. ENT:      Ears: Tympanic membranes are mildly injected with mild effusion bilaterally.       Nose: No congestion/rhinnorhea.      Mouth/Throat: Mucous membranes are moist. Posterior pharynx is mildly erythematous.  Hematological/Lymphatic/Immunilogical: No cervical lymphadenopathy.  Cardiovascular: Normal rate, regular rhythm. Normal S1 and S2.  Good peripheral circulation. Respiratory: Normal respiratory effort without tachypnea or retractions. Lungs CTAB. Good air entry to the bases with no decreased or absent breath sounds. Gastrointestinal: Bowel sounds 4 quadrants. Soft and nontender to palpation. No guarding or rigidity. No palpable masses. No distention. No CVA tenderness. Musculoskeletal: Full range of  motion to all extremities. No gross deformities appreciated. Neurologic:  Normal speech and language. No gross focal neurologic deficits are appreciated.  Skin:  Skin is warm, dry and intact. No rash noted. Psychiatric: Mood and affect are normal. Speech and behavior are normal. Patient exhibits appropriate insight and judgement.   ED Results / Procedures / Treatments   Labs (all labs ordered are listed, but only abnormal results are displayed) Labs Reviewed  RESP PANEL BY RT-PCR (FLU A&B, COVID) ARPGX2 - Abnormal; Notable for the following components:      Result Value   SARS Coronavirus 2 by RT PCR POSITIVE (*)    All other components within normal limits  GROUP A STREP BY PCR        PROCEDURES:  Critical Care performed: No  Procedures   MEDICATIONS ORDERED IN ED: Medications  acetaminophen (TYLENOL) tablet 650 mg (650 mg Oral Given 03/13/22 2255)     IMPRESSION / MDM / ASSESSMENT AND PLAN / ED COURSE  I reviewed the triage vital signs and the nursing notes.                              Assessment and plan COVID-11 21 year old female presents  to the emergency department with viral URI-like symptoms.  Group A strep testing was negative.  COVID-19 testing is positive.  Rest and hydration were encouraged at home.  Return precautions were given to return with new or worsening symptoms.      FINAL CLINICAL IMPRESSION(S) / ED DIAGNOSES   Final diagnoses:  COVID-19     Rx / DC Orders   ED Discharge Orders     None        Note:  This document was prepared using Dragon voice recognition software and may include unintentional dictation errors.   Pia Mau [place], PA-C 03/13/22 2310    Sharyn Creamer, MD 03/13/22 2324

## 2022-05-07 ENCOUNTER — Other Ambulatory Visit: Payer: Self-pay

## 2022-05-07 ENCOUNTER — Encounter: Payer: Self-pay | Admitting: Emergency Medicine

## 2022-05-07 ENCOUNTER — Emergency Department
Admission: EM | Admit: 2022-05-07 | Discharge: 2022-05-07 | Disposition: A | Discharge status: Home / Self Care | Payer: BC Managed Care – PPO | Attending: Emergency Medicine | Admitting: Emergency Medicine

## 2022-05-07 DIAGNOSIS — F1010 Alcohol abuse, uncomplicated: Secondary | ICD-10-CM | POA: Diagnosis not present

## 2022-05-07 DIAGNOSIS — E876 Hypokalemia: Secondary | ICD-10-CM | POA: Diagnosis not present

## 2022-05-07 DIAGNOSIS — Y909 Presence of alcohol in blood, level not specified: Secondary | ICD-10-CM | POA: Insufficient documentation

## 2022-05-07 DIAGNOSIS — R112 Nausea with vomiting, unspecified: Secondary | ICD-10-CM

## 2022-05-07 DIAGNOSIS — R824 Acetonuria: Secondary | ICD-10-CM | POA: Diagnosis not present

## 2022-05-07 LAB — URINALYSIS, ROUTINE W REFLEX MICROSCOPIC
Bilirubin Urine: NEGATIVE
Glucose, UA: NEGATIVE mg/dL
Hgb urine dipstick: NEGATIVE
Ketones, ur: 80 mg/dL — AB
Leukocytes,Ua: NEGATIVE
Nitrite: NEGATIVE
Protein, ur: 100 mg/dL — AB
Specific Gravity, Urine: 1.027 (ref 1.005–1.030)
pH: 9 — ABNORMAL HIGH (ref 5.0–8.0)

## 2022-05-07 LAB — COMPREHENSIVE METABOLIC PANEL
ALT: 12 U/L (ref 0–44)
AST: 33 U/L (ref 15–41)
Albumin: 4.7 g/dL (ref 3.5–5.0)
Alkaline Phosphatase: 72 U/L (ref 38–126)
Anion gap: 10 (ref 5–15)
BUN: 11 mg/dL (ref 6–20)
CO2: 24 mmol/L (ref 22–32)
Calcium: 9.1 mg/dL (ref 8.9–10.3)
Chloride: 109 mmol/L (ref 98–111)
Creatinine, Ser: 0.59 mg/dL (ref 0.44–1.00)
GFR, Estimated: >60 mL/min (ref >60)
Glucose, Bld: 110 mg/dL — ABNORMAL HIGH (ref 70–99)
Potassium: 3.4 mmol/L — ABNORMAL LOW (ref 3.5–5.1)
Sodium: 143 mmol/L (ref 135–145)
Total Bilirubin: 0.8 mg/dL (ref 0.3–1.2)
Total Protein: 7.6 g/dL (ref 6.5–8.1)

## 2022-05-07 LAB — CBC WITH DIFFERENTIAL/PLATELET
Abs Immature Granulocytes: 0.02 10*3/uL (ref 0.00–0.07)
Basophils Absolute: 0.1 10*3/uL (ref 0.0–0.1)
Basophils Relative: 1 %
Eosinophils Absolute: 0 10*3/uL (ref 0.0–0.5)
Eosinophils Relative: 0 %
HCT: 41.2 % (ref 36.0–46.0)
Hemoglobin: 13.7 g/dL (ref 12.0–15.0)
Immature Granulocytes: 0 %
Lymphocytes Relative: 17 %
Lymphs Abs: 1.1 10*3/uL (ref 0.7–4.0)
MCH: 29.3 pg (ref 26.0–34.0)
MCHC: 33.3 g/dL (ref 30.0–36.0)
MCV: 88.2 fL (ref 80.0–100.0)
Monocytes Absolute: 0.3 10*3/uL (ref 0.1–1.0)
Monocytes Relative: 5 %
Neutro Abs: 4.7 10*3/uL (ref 1.7–7.7)
Neutrophils Relative %: 77 %
Platelets: 324 10*3/uL (ref 150–400)
RBC: 4.67 MIL/uL (ref 3.87–5.11)
RDW: 12.3 % (ref 11.5–15.5)
WBC: 6.2 10*3/uL (ref 4.0–10.5)
nRBC: 0 % (ref 0.0–0.2)

## 2022-05-07 LAB — LIPASE, BLOOD: Lipase: 25 U/L (ref 11–51)

## 2022-05-07 LAB — POC URINE PREG, ED: Preg Test, Ur: NEGATIVE

## 2022-05-07 MED ORDER — POTASSIUM CHLORIDE CRYS ER 20 MEQ PO TBCR
40.0000 meq | EXTENDED_RELEASE_TABLET | Freq: Once | ORAL | Status: AC
  Administered 2022-05-07: 40 meq via ORAL
  Filled 2022-05-07: qty 2

## 2022-05-07 MED ORDER — ONDANSETRON 4 MG PO TBDP: 4.0000 mg | ORAL_TABLET | Freq: Three times a day (TID) | ORAL | 0 refills | Status: AC | PRN

## 2022-05-07 MED ORDER — ONDANSETRON HCL 4 MG/2ML IJ SOLN
4.0000 mg | Freq: Once | INTRAMUSCULAR | Status: AC
  Administered 2022-05-07: 4 mg via INTRAVENOUS
  Filled 2022-05-07: qty 2

## 2022-05-07 MED ORDER — SODIUM CHLORIDE 0.9 % IV BOLUS
1000.0000 mL | Freq: Once | INTRAVENOUS | Status: AC
  Administered 2022-05-07: 1000 mL via INTRAVENOUS

## 2022-05-07 NOTE — ED Triage Notes (Signed)
Pt reports drank too much liquor last pm and today cannot keep anything down. Pt concerned about getting too dehydrated.

## 2022-05-07 NOTE — ED Provider Notes (Signed)
[facility] Provider Note    Event Date/Time   First MD Initiated Contact with Patient 05/07/22 [phone]     (approximate)   History   Emesis   HPI  Jasmine Hopkins is a 21 y.o. female who comes in with concerns for nausea and vomiting.  Patient reports last night going out bars and having liquor.  She reports having multiple episodes of vomiting this morning and just overall not feeling well.  She denies anyone trying to take advantage of her or any sexual assault or things like that.  She denies any pain in her abdomen.   Physical Exam   Triage Vital Signs: ED Triage Vitals  Enc Vitals Group     BP 05/07/22 0941 122/72     Pulse Rate 05/07/22 0941 61     Resp 05/07/22 0941 20     Temp 05/07/22 0943 98.2 F (36.8 C)     Temp Source 05/07/22 0943 Oral     SpO2 05/07/22 0941 97 %     Weight 05/07/22 0938 180 lb (81.6 kg)     Height 05/07/22 0938 5\' 4"  (1.626 m)     Head Circumference --      Peak Flow --      Pain Score 05/07/22 0938 0     Pain Loc --      Pain Edu? --      Excl. in GC? --     Most recent vital signs: Vitals:   05/07/22 0941 05/07/22 0943  BP: 122/72   Pulse: 61   Resp: 20   Temp:  98.2 F (36.8 C)  SpO2: 97%      General: Awake, no distress.  CV:  Good peripheral perfusion.  Resp:  Normal effort.  Abd:  No distention.  Soft and nontender Other:     ED Results / Procedures / Treatments   Labs (all labs ordered are listed, but only abnormal results are displayed) Labs Reviewed  CBC WITH DIFFERENTIAL/PLATELET  COMPREHENSIVE METABOLIC PANEL  LIPASE, BLOOD       RADIOLOGY   PROCEDURES:  Critical Care performed: No  Procedures   MEDICATIONS ORDERED IN ED: Medications  sodium chloride 0.9 % bolus 1,000 mL (has no administration in time range)  ondansetron (ZOFRAN) injection 4 mg (has no administration in time range)     IMPRESSION / MDM / ASSESSMENT AND PLAN / ED COURSE  I reviewed the triage  vital signs and the nursing notes.   Patient's presentation is most consistent with acute, uncomplicated illness.   Labs ordered evaluate for any Electra abnormalities, hyperglycemia, DKA, pregnancy, UTI.  Abdomen currently soft and nontender low suspicion for acute abdominal process.  We will give some IV fluids IV Zofran to help with symptoms  CBC is normal.  Pregnancy test negative.  Urine with some ketones noted and in the patient's been getting fluids and is now tolerating p.o.  CMP shows slightly low potassium we will give some oral repletion.   11:27 AM repeat evaluation patient's abdomen soft and nontender  12:36 PM patient tolerating p.o.  Repeat abdomen exam soft and nontender.  Patient was cleared for discharge home understands to avoid alcohol in excess.   The patient is on the cardiac monitor to evaluate for evidence of arrhythmia and/or significant heart rate changes.      FINAL CLINICAL IMPRESSION(S) / ED DIAGNOSES   Final diagnoses:  Nausea and vomiting, unspecified vomiting type  Alcohol abuse  Rx / DC Orders   ED Discharge Orders          Ordered    ondansetron (ZOFRAN-ODT) 4 MG disintegrating tablet  Every 8 hours PRN        05/07/22 1237             Note:  This document was prepared using Dragon voice recognition software and may include unintentional dictation errors.   Concha Se, MD 05/07/22 [phone]

## 2022-05-07 NOTE — Discharge Instructions (Addendum)
Try to cut down your alcohol use--take  Zofran to help with any nausea and return to the ER if you develop worsening symptoms or any other concerns.  Your potassium was slightly low and I given you a pill to help fix that.

## 2022-05-30 ENCOUNTER — Encounter: Payer: Self-pay | Admitting: Internal Medicine

## 2022-05-30 ENCOUNTER — Ambulatory Visit (INDEPENDENT_AMBULATORY_CARE_PROVIDER_SITE_OTHER): Payer: BC Managed Care – PPO | Admitting: Internal Medicine

## 2022-05-30 VITALS — BP 113/71 | HR 66 | Temp 97.6°F | Resp 16 | Ht 64.0 in | Wt 182.7 lb

## 2022-05-30 DIAGNOSIS — F332 Major depressive disorder, recurrent severe without psychotic features: Secondary | ICD-10-CM | POA: Diagnosis not present

## 2022-05-30 DIAGNOSIS — F4310 Post-traumatic stress disorder, unspecified: Secondary | ICD-10-CM

## 2022-05-30 DIAGNOSIS — Z0000 Encounter for general adult medical examination without abnormal findings: Secondary | ICD-10-CM

## 2022-05-30 DIAGNOSIS — F419 Anxiety disorder, unspecified: Secondary | ICD-10-CM

## 2022-05-30 DIAGNOSIS — R45851 Suicidal ideations: Secondary | ICD-10-CM | POA: Diagnosis not present

## 2022-05-30 LAB — POCT URINALYSIS DIP (MANUAL ENTRY)
Bilirubin, UA: NEGATIVE
Blood, UA: NEGATIVE
Glucose, UA: NEGATIVE mg/dL
Ketones, POC UA: NEGATIVE mg/dL
Leukocytes, UA: NEGATIVE
Nitrite, UA: NEGATIVE
Spec Grav, UA: 1.015 (ref 1.010–1.025)
Urobilinogen, UA: NEGATIVE E.U./dL — AB
pH, UA: 7.5 (ref 5.0–8.0)

## 2022-05-30 NOTE — Assessment & Plan Note (Signed)
Suggest to read the book feeling good

## 2022-05-30 NOTE — Assessment & Plan Note (Signed)
-   Patient experiencing high levels of anxiety.  - Encouraged patient to engage in relaxing activities like yoga, meditation, journaling, going for a walk, or participating in a hobby.  - Encouraged patient to reach out to trusted friends or family members about recent struggles, Patient was advised to read A book, how to stop worrying and start living, it is good book to read to control  the stress  
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
## 2022-05-30 NOTE — Addendum Note (Signed)
Addended by: Eldred Manges on: 05/30/2022 10:20 AM   Modules accepted: Orders

## 2022-05-30 NOTE — Assessment & Plan Note (Signed)
Patient has no thoughts to hurt herself

## 2022-08-28 ENCOUNTER — Ambulatory Visit: Payer: BC Managed Care – PPO | Admitting: Internal Medicine

## 2022-10-06 ENCOUNTER — Ambulatory Visit: Payer: Self-pay | Admitting: Family Medicine

## 2022-10-06 DIAGNOSIS — G35 Multiple sclerosis: Secondary | ICD-10-CM | POA: Insufficient documentation

## 2022-10-06 DIAGNOSIS — Z113 Encounter for screening for infections with a predominantly sexual mode of transmission: Secondary | ICD-10-CM

## 2022-10-06 LAB — WET PREP FOR TRICH, YEAST, CLUE
Trichomonas Exam: NEGATIVE
Yeast Exam: NEGATIVE

## 2022-10-06 LAB — HM HIV SCREENING LAB: HM HIV Screening: NEGATIVE

## 2022-10-06 LAB — HEPATITIS B SURFACE ANTIGEN: Hepatitis B Surface Ag: NEGATIVE

## 2022-10-06 LAB — HM HEPATITIS C SCREENING LAB: HM Hepatitis Screen: NEGATIVE

## 2022-10-06 NOTE — Progress Notes (Signed)
[facility] Department  STI clinic/screening visit [place] [address] [phone]  Subjective:  Gaylen Pereira is a 22 y.o. female being seen today for an STI screening visit. The patient reports they do not have symptoms.  Patient reports that they do not desire a pregnancy in the next year.   They reported they are not interested in discussing contraception today.    Patient's last menstrual period was 09/24/2022 (exact date).   Patient has the following medical conditions:   Patient Active Problem List   Diagnosis Date Noted   Multiple sclerosis ([place]) 10/06/2022   Acute appendicitis 05/29/2020   Anxiety 06/12/2019   Eye problem 10/10/2018   PTSD (post-traumatic stress disorder) 01/02/2014   Suicidal ideations 01/02/2014   ADHD (attention deficit hyperactivity disorder) 01/02/2014   Suicide ideation 01/02/2014   MDD (major depressive disorder), recurrent severe, without psychosis ([place]) 01/01/2014    Chief Complaint  Patient presents with   SEXUALLY TRANSMITTED DISEASE    Routine screening, PT test    HPI  Patient reports to clinic for routine STI testing  Does the patient using douching products? No  Last HIV test per patient/review of record was No results found for: "HMHIVSCREEN"  Lab Results  Component Value Date   HIV Non Reactive 05/30/2020   Patient reports last pap was never- encouraged to make appointment for Pap   Screening for MPX risk: Does the patient have an unexplained rash? No Is the patient MSM? No Does the patient endorse multiple sex partners or anonymous sex partners? No Did the patient have close or sexual contact with a person diagnosed with MPX? No Has the patient traveled outside the [place] where MPX is endemic? No Is there a high clinical suspicion for MPX-- evidenced by one of the following No  -Unlikely to be chickenpox  -Lymphadenopathy  -Rash that present in same phase of evolution on any given  body part See flowsheet for further details and programmatic requirements.   Immunization history:   There is no immunization history on file for this patient.   The following portions of the patient's history were reviewed and updated as appropriate: allergies, current medications, past medical history, past social history, past surgical history and problem list.  Objective:  There were no vitals filed for this visit.  Physical Exam Vitals and nursing note reviewed.  Constitutional:      Appearance: Normal appearance.  HENT:     Head: Normocephalic and atraumatic.     Mouth/Throat:     Mouth: Mucous membranes are moist.     Pharynx: Oropharynx is clear. No oropharyngeal exudate or posterior oropharyngeal erythema.  Pulmonary:     Effort: Pulmonary effort is normal.  Abdominal:     General: Abdomen is flat.     Palpations: There is no mass.     Tenderness: There is no abdominal tenderness. There is no rebound.  Genitourinary:    Comments: Declined genital exam- self swabbed Lymphadenopathy:     Head:     Right side of head: No preauricular or posterior auricular adenopathy.     Left side of head: No preauricular or posterior auricular adenopathy.     Cervical: No cervical adenopathy.     Upper Body:     Right upper body: No supraclavicular, axillary or epitrochlear adenopathy.     Left upper body: No supraclavicular, axillary or epitrochlear adenopathy.  Skin:    General: Skin is warm and dry.  Findings: No rash.  Neurological:     Mental Status: She is alert and oriented to person, place, and time.      Assessment and Plan:  Joellyn Grandt is a 22 y.o. female presenting to the [facility] Department for STI screening  1. Screening for venereal disease Has not had a period since October- taken many pregnancy tests- all negative. Declined to have PT done today. Has had irregular periods for a long time. Using withdrawal method. Discussed that this is not  a reliable BCM. Patient declined other BC today.  - WET PREP FOR [place], YEAST, CLUE - Syphilis Serology, [facility] - Gonococcus culture - HIV/HCV  Lab - HBV Antigen/Antibody State Lab - [facility]  Patient accepted all screenings including oral, vaginal CT/GC and bloodwork for HIV/RPR, and wet prep. Patient meets criteria for HepB screening? Yes. Ordered? Yes Patient meets criteria for HepC screening? Yes. Ordered? Yes  Treat wet prep per standing order Discussed time line for State Lab results and that patient will be called with positive results and encouraged patient to call if she had not heard in 2 weeks.  Counseled to return or seek care for continued or worsening symptoms Recommended condom use with all sex  Patient is currently using  withdrawal method  to prevent pregnancy.    Return if symptoms worsen or fail to improve.  Total time spent 15 minutes  Sharlet Salina, [place]

## 2022-10-06 NOTE — Progress Notes (Signed)
Pt. seen for routine STI screening by [facility]. Wet mount results reviewed with pt. Condoms given, no tx indicated.

## 2022-10-10 ENCOUNTER — Telehealth: Payer: Self-pay | Admitting: Licensed Clinical Social Worker

## 2022-10-10 LAB — GONOCOCCUS CULTURE

## 2022-10-10 NOTE — Telephone Encounter (Signed)
Patient lvm for LCSW requesting services. LCSW attempted to call patient and lvm.

## 2022-10-27 ENCOUNTER — Ambulatory Visit: Payer: Self-pay

## 2023-02-09 ENCOUNTER — Ambulatory Visit: Payer: BC Managed Care – PPO

## 2023-03-06 ENCOUNTER — Ambulatory Visit: Payer: BC Managed Care – PPO

## 2023-04-06 ENCOUNTER — Ambulatory Visit: Payer: BC Managed Care – PPO

## 2023-04-19 ENCOUNTER — Ambulatory Visit: Payer: BC Managed Care – PPO

## 2023-07-24 ENCOUNTER — Ambulatory Visit: Payer: BC Managed Care – PPO

## 2023-08-08 ENCOUNTER — Encounter: Payer: Self-pay | Admitting: Family Medicine

## 2023-08-08 ENCOUNTER — Ambulatory Visit: Payer: BC Managed Care – PPO | Admitting: Family Medicine

## 2023-08-08 VITALS — BP 104/68 | HR 60 | Temp 97.5°F | Wt 163.2 lb

## 2023-08-08 DIAGNOSIS — Z308 Encounter for other contraceptive management: Secondary | ICD-10-CM | POA: Diagnosis not present

## 2023-08-08 DIAGNOSIS — Z113 Encounter for screening for infections with a predominantly sexual mode of transmission: Secondary | ICD-10-CM

## 2023-08-08 DIAGNOSIS — Z01419 Encounter for gynecological examination (general) (routine) without abnormal findings: Secondary | ICD-10-CM

## 2023-08-08 DIAGNOSIS — Z3009 Encounter for other general counseling and advice on contraception: Secondary | ICD-10-CM

## 2023-08-08 LAB — WET PREP FOR TRICH, YEAST, CLUE: Trichomonas Exam: NEGATIVE

## 2023-08-08 MED ORDER — CLOTRIMAZOLE 1 % VA CREA: 1.0000 | TOPICAL_CREAM | Freq: Every day | VAGINAL | Status: AC

## 2023-08-08 NOTE — Progress Notes (Signed)
[facility] DEPARTMENT [facility] [address] Main Number: [phone]   Family Planning Visit- Initial Visit  Subjective:  Jasmine Hopkins is a 22 y.o.  [id number]   being seen today for an initial annual visit and to discuss reproductive life planning.  The patient is currently using Pregnant/Seeking Pregnancy for pregnancy prevention. Patient reports   does want a pregnancy in the next year.     report they are looking for a method that provides Other desires to become pregnant  Patient has the following medical conditions has MDD (major depressive disorder), recurrent severe, without psychosis (HCC); PTSD (post-traumatic stress disorder); Suicidal ideations; ADHD (attention deficit hyperactivity disorder); Suicide ideation; Eye problem; Anxiety; Acute appendicitis; and Multiple sclerosis (HCC) on their problem list.  Chief Complaint  Patient presents with   Contraception    Patient reports to clinic for PE. Desires to become pregnant, reports she has been tracking her cycle in an app and that she and her partner have been trying since January.     Body mass index is 28.01 kg/m. - Patient is eligible for diabetes screening based on BMI> 25 and age >35?  no HA1C ordered? not applicable  Patient reports 1  partner/s in last year. Desires STI screening?  No - declines- had testing done at another clinic a week ago- during exam decided she wanted wet prep done  Has patient been screened once for HCV in the past?  No  No results found for: "HCVAB"  Does the patient have current drug use (including MJ), have a partner with drug use, and/or has been incarcerated since last result? No  If yes-- Screen for HCV through [facility]   Does the patient meet criteria for HBV testing? No  Criteria:  -Household, sexual or needle sharing contact with HBV -History of drug use -HIV positive -Those with known Hep C   Health Maintenance Due  Topic Date  Due   HPV VACCINES (1 - 3-dose series) Never done   DTaP/Tdap/Td (1 - Tdap) Never done   Cervical Cancer Screening (Pap smear)  Never done   INFLUENZA VACCINE  Never done   COVID-19 Vaccine (1 - 2023-24 season) Never done    Review of Systems  Constitutional:  Negative for weight loss.  Eyes:  Negative for blurred vision.  Respiratory:  Negative for cough and shortness of breath.   Cardiovascular:  Negative for claudication.  Gastrointestinal:  Negative for nausea.  Genitourinary:  Negative for dysuria and frequency.  Skin:  Negative for rash.  Neurological:  Positive for headaches.  Endo/Heme/Allergies:  Does not bruise/bleed easily.    The following portions of the patient's history were reviewed and updated as appropriate: allergies, current medications, past family history, past medical history, past social history, past surgical history and problem list. Problem list updated.   See flowsheet for other program required questions.  Objective:   Vitals:   08/08/23 1008  BP: 104/68  Pulse: 60  Temp: (!) 97.5 F (36.4 C)  Weight: 163 lb 3.2 oz (74 kg)    Physical Exam Vitals and nursing note reviewed. Chaperone present: declined chaperone.  Constitutional:      Appearance: Normal appearance.  HENT:     Head: Normocephalic and atraumatic.     Mouth/Throat:     Mouth: Mucous membranes are moist.     Pharynx: Oropharynx is clear. No oropharyngeal exudate or posterior oropharyngeal erythema.  Pulmonary:     Effort: Pulmonary effort is  normal.  Abdominal:     General: Abdomen is flat.     Palpations: There is no mass.     Tenderness: There is no abdominal tenderness. There is no rebound.  Genitourinary:    General: Normal vulva.     Exam position: Lithotomy position.     Pubic Area: No rash or pubic lice.      Labia:        Right: No rash or lesion.        Left: No rash or lesion.      Vagina: Vaginal discharge present. No erythema, bleeding or lesions.      Cervix: No cervical motion tenderness, discharge, friability, lesion or erythema.     Uterus: Normal.      Adnexa: Right adnexa normal and left adnexa normal.     Rectum: Normal.     Comments: pH = 4  Thick white discharge present  Lymphadenopathy:     Head:     Right side of head: No preauricular or posterior auricular adenopathy.     Left side of head: No preauricular or posterior auricular adenopathy.     Cervical: No cervical adenopathy.     Upper Body:     Right upper body: No supraclavicular, axillary or epitrochlear adenopathy.     Left upper body: No supraclavicular, axillary or epitrochlear adenopathy.     Lower Body: No right inguinal adenopathy. No left inguinal adenopathy.  Skin:    General: Skin is warm and dry.     Findings: No rash.  Neurological:     Mental Status: She is alert and oriented to person, place, and time.       Assessment and Plan:  Jasmine Hopkins is a 22 y.o. female presenting to the [facility] Department for an initial annual wellness/contraceptive visit  Contraception counseling: Reviewed options based on patient desire and reproductive life plan. Patient is interested in No Method - Other Reason. This was provided to the patient today.   Risks, benefits, and typical effectiveness rates were reviewed.  Questions were answered.  Written information was also given to the patient to review.    The patient will follow up in  1 years for surveillance.  The patient was told to call with any further questions, or with any concerns about this method of contraception.  Emphasized use of condoms 100% of the time for STI prevention.  Educated on ECP and assessed for need of ECP. Not indicated- desires pregnancy   1. Family planning -reviewed going to PCP for eval of thyroid- also discussed decreasing caffeine intake, getting good sleep, exercise and eating well -reviewed that after consistently trying for 1 year- considered infertility issue,  however it is good to r/o other causes  -after reviewing her tracker- appears her cycle length is long- 45-48 days which is abnormal, I reviewed that with ovulation strips, can try to pinpoint when her ovulation happens, appears she has been having unprotected sex about 2x/week during her ovulation window- I reviewed that at times these predictions can be inaccurate -pt has insurance through work, and I encouraged her to ask about what coverage looks like for fertility.  -while patient is young, pt has known MS, so understandable to be a little more aggressive in evaluation -pt plans to try some of the suggestions provided   2. Well woman exam with routine gynecological exam -first pap today CBE not indicated until 25 per ACOG guidelines  - IGP, rfx Aptima HPV ASCU  Return in about 1 year (around 08/07/2024) for annual well-woman exam.  No future appointments.  Lenice Llamas, [place]

## 2023-08-08 NOTE — Progress Notes (Signed)
In house wet mount results reviewed at visit. The patient was dispensed Clotrimazole vaginal cream today. I provided counseling today regarding the medication. We discussed the medication, the side effects and when to call clinic. Patient given the opportunity to ask questions. Questions answered. BTHIELE RN

## 2023-08-13 LAB — IGP, RFX APTIMA HPV ASCU: PAP Smear Comment: 0

## 2023-08-21 ENCOUNTER — Telehealth: Payer: Self-pay

## 2023-08-21 NOTE — Telephone Encounter (Signed)
wants to do hep C testing and wants to know if insurance covers it or how much it is if insurance doesn't cover. please call back.

## 2023-08-22 ENCOUNTER — Ambulatory Visit: Payer: BC Managed Care – PPO

## 2023-08-23 ENCOUNTER — Ambulatory Visit: Payer: BC Managed Care – PPO

## 2023-09-10 NOTE — Telephone Encounter (Signed)
Returned call to pt. Pr pt she had an appt here and also with her PCP . She  got testing done through [facility] office. Appreciative for call.

## 2023-09-10 NOTE — Telephone Encounter (Signed)
Called and spoke with patient. Pt stated she had an appt here scheduled ,but also with her PCP. She discussed and received testing there.

## 2023-10-24 ENCOUNTER — Other Ambulatory Visit: Payer: Self-pay | Admitting: Neurology

## 2023-10-24 DIAGNOSIS — G35 Multiple sclerosis: Secondary | ICD-10-CM

## 2023-11-12 ENCOUNTER — Ambulatory Visit
Admission: RE | Admit: 2023-11-12 | Discharge: 2023-11-12 | Disposition: A | Discharge status: Home / Self Care | Payer: BC Managed Care – PPO | Source: Ambulatory Visit | Attending: Neurology | Admitting: Neurology

## 2023-11-12 DIAGNOSIS — G35 Multiple sclerosis: Secondary | ICD-10-CM | POA: Insufficient documentation

## 2023-11-12 MED ORDER — GADOBUTROL 1 MMOL/ML IV SOLN
7.0000 mL | Freq: Once | INTRAVENOUS | Status: AC | PRN
  Administered 2023-11-12: 7 mL via INTRAVENOUS
